# Patient Record
Sex: Female | Born: 1990 | Race: Black or African American | Hispanic: No | Marital: Single | Smoking: Current every day smoker
Health system: Southern US, Community
[De-identification: ages and names within clinical notes are randomized; demographics above are authoritative.]

## PROBLEM LIST (undated history)

## (undated) DIAGNOSIS — F419 Anxiety disorder, unspecified: Secondary | ICD-10-CM

## (undated) HISTORY — PX: FINGER SURGERY: SHX640

---

## 2018-11-05 ENCOUNTER — Emergency Department (HOSPITAL_COMMUNITY)
Admission: EM | Admit: 2018-11-05 | Discharge: 2018-11-05 | Disposition: A | Discharge status: Home / Self Care | Payer: Self-pay | Attending: Emergency Medicine | Admitting: Emergency Medicine

## 2018-11-05 ENCOUNTER — Encounter (HOSPITAL_COMMUNITY): Payer: Self-pay | Admitting: *Deleted

## 2018-11-05 ENCOUNTER — Emergency Department (HOSPITAL_COMMUNITY): Payer: Self-pay

## 2018-11-05 DIAGNOSIS — R69 Illness, unspecified: Secondary | ICD-10-CM

## 2018-11-05 DIAGNOSIS — J111 Influenza due to unidentified influenza virus with other respiratory manifestations: Secondary | ICD-10-CM | POA: Insufficient documentation

## 2018-11-05 DIAGNOSIS — F17200 Nicotine dependence, unspecified, uncomplicated: Secondary | ICD-10-CM | POA: Insufficient documentation

## 2018-11-05 MED ORDER — FLUTICASONE PROPIONATE 50 MCG/ACT NA SUSP: 1.0000 | Freq: Every day | NASAL | 0 refills | Status: DC

## 2018-11-05 MED ORDER — IBUPROFEN 800 MG PO TABS: 800.0000 mg | ORAL_TABLET | Freq: Three times a day (TID) | ORAL | 0 refills | Status: DC

## 2018-11-05 MED ORDER — BENZONATATE 100 MG PO CAPS: 100.0000 mg | ORAL_CAPSULE | Freq: Three times a day (TID) | ORAL | 0 refills | Status: DC | PRN

## 2018-11-05 NOTE — ED Provider Notes (Signed)
[facility] EMERGENCY DEPARTMENT Provider Note   CSN: [id number] Arrival date & time: 11/05/18  1227     History   Chief Complaint Chief Complaint  Patient presents with  . Cough    HPI Amanda Powell is a 28 y.o. female with a history of tobacco abuse presents to the emergency department with complaints of flulike symptoms for the past 3 to 4 days.  Patient states she had congestion, rhinorrhea, scratchy throat, productive cough with green mucus sputum, chills, and generalized body aches.  No specific alleviating or aggravating factors.  She did try some over-the-counter off brand Robitussin without relief-this did help her sleep but did not change her symptoms.  Denies fever, chest pain, dyspnea, wheezing, vomiting, or diarrhea.  HPI  History reviewed. No pertinent past medical history.  There are no active problems to display for this patient.   History reviewed. No pertinent surgical history.   OB History   No obstetric history on file.      Home Medications    Prior to Admission medications   Not on File    Family History History reviewed. No pertinent family history.  Social History Social History   Tobacco Use  . Smoking status: Current Every Day Smoker  . Smokeless tobacco: Never Used  Substance Use Topics  . Alcohol use: Not on file  . Drug use: Not on file     Allergies   Patient has no known allergies.   Review of Systems Review of Systems  Constitutional: Positive for chills. Negative for fever.  HENT: Positive for congestion and sore throat. Negative for ear pain.   Respiratory: Positive for cough. Negative for shortness of breath and wheezing.   Cardiovascular: Negative for chest pain.  Gastrointestinal: Negative for abdominal pain, diarrhea and vomiting.     Physical Exam Updated Vital Signs BP (!) 108/91 (BP Location: Right Arm)   Pulse 94   Temp 98.6 F (37 C) Comment: Took Tylenol at 11:50AM today; 500mg   Resp  17   Ht 5\' 3"  (1.6 m)   Wt 89.4 kg   LMP 10/22/2018   SpO2 100%   BMI 34.90 kg/m   Physical Exam Vitals signs and nursing note reviewed.  Constitutional:      General: She is not in acute distress.    Appearance: She is well-developed.  HENT:     Head: Normocephalic and atraumatic.     Right Ear: Tympanic membrane is not perforated, erythematous, retracted or bulging.     Left Ear: Tympanic membrane is not perforated, erythematous, retracted or bulging.     Nose: Mucosal edema present.     Right Sinus: No maxillary sinus tenderness or frontal sinus tenderness.     Left Sinus: No maxillary sinus tenderness or frontal sinus tenderness.     Mouth/Throat:     Pharynx: Uvula midline. No oropharyngeal exudate or posterior oropharyngeal erythema.  Eyes:     General:        Right eye: No discharge.        Left eye: No discharge.     Conjunctiva/sclera: Conjunctivae normal.     Pupils: Pupils are equal, round, and reactive to light.  Neck:     Musculoskeletal: Normal range of motion and neck supple. No neck rigidity.  Cardiovascular:     Rate and Rhythm: Normal rate and regular rhythm.     Heart sounds: No murmur.  Pulmonary:     Effort: No respiratory distress.  Breath sounds: Normal breath sounds. No wheezing or rales.  Abdominal:     General: There is no distension.     Palpations: Abdomen is soft.     Tenderness: There is no abdominal tenderness.  Lymphadenopathy:     Cervical: No cervical adenopathy.  Skin:    General: Skin is warm and dry.     Findings: No rash.  Neurological:     Mental Status: She is alert.  Psychiatric:        Behavior: Behavior normal.      ED Treatments / Results  Labs (all labs ordered are listed, but only abnormal results are displayed) Labs Reviewed - No data to display  EKG None  Radiology Dg Chest 2 View  Result Date: 11/05/2018 CLINICAL DATA:  Cough, body aches, chest tightness for 2-3 days - no known heart or lung issues, hx  of smoker EXAM: CHEST - 2 VIEW COMPARISON:  None. FINDINGS: The heart size and mediastinal contours are within normal limits. Both lungs are clear. No pleural effusion or pneumothorax. The visualized skeletal structures are unremarkable. IMPRESSION: Normal chest radiographs. Electronically Signed   By: Amie Portland M.D.   On: 11/05/2018 13:32    Procedures Procedures (including critical care time)  Medications Ordered in ED Medications - No data to display   Initial Impression / Assessment and Plan / ED Course  I have reviewed the triage vital signs and the nursing notes.  Pertinent labs & imaging results that were available during my care of the patient were reviewed by me and considered in my medical decision making (see chart for details).    Patient presents with URI type symptoms.  Patient is nontoxic appearing, in no apparent distress, vitals are without significant abnormality. Patient is afebrile in the ED, lungs are CTA, CXR negative for infiltrate, doubt pneumonia. There is no wheezing or signs of respiratory distress. Sxs onset < 7 days, afebrile, no sinus tenderness, doubt acute bacterial sinusitis. Centor score 0, doubt strep pharyngitis. No evidence of AOM on exam. No meningeal signs. Suspect viral etiology at this time, possibly influenza- outside of tamiflu window, and will treat supportively with Ibuprofen, Flonase, and Tessalon. I discussed results, treatment plan, need for PCP follow-up, and return precautions with the patient. Provided opportunity for questions, patient confirmed understanding and is in agreement with plan.   Final Clinical Impressions(s) / ED Diagnoses   Final diagnoses:  Influenza-like illness    ED Discharge Orders         Ordered    fluticasone (FLONASE) 50 MCG/ACT nasal spray  Daily     11/05/18 1418    benzonatate (TESSALON) 100 MG capsule  3 times daily PRN     11/05/18 1418    ibuprofen (ADVIL,MOTRIN) 800 MG tablet  3 times daily      11/05/18 [address], [place] R, PA-C 11/05/18 1439    Terrilee Files, MD 11/06/18 [phone]

## 2018-11-05 NOTE — Discharge Instructions (Addendum)
You were seen in the emergency today for upper respiratory symptoms, we suspect your symptoms are related to a virus at this time. Your chest xray was normal . We have prescribed you multiple medications to treat your symptoms.  ° °-Flonase to be used 1 spray in each nostril daily.  This medication is used to treat your congestion. ° °-Tessalon can be taken once every 8 hours as needed.  This medication is used to treat your cough. ° °-Ibuprofen to be taken once every 8 hours as needed for pain. Please take this medicine with food as it can cause stomach upset and at worst stomach bleeding. Do not take other NSAIDs such as motrin, aleve, advil, naproxen, mobic, etc as they are similar. You make take tylenol per over the counter dosing with this medicine safely. ° °We have prescribed you new medication(s) today. Discuss the medications prescribed today with your pharmacist as they can have adverse effects and interactions with your other medicines including over the counter and prescribed medications. Seek medical evaluation if you start to experience new or abnormal symptoms after taking one of these medicines, seek care immediately if you start to experience difficulty breathing, feeling of your throat closing, facial swelling, or rash as these could be indications of a more serious allergic reaction ° °You will need to follow-up with your primary care provider in 1 week if your symptoms have not improved.  If you do not have a primary care provider one is provided in your discharge instructions.  Return to the emergency department for any new or worsening symptoms including but not limited to persistent fever for 5 days, difficulty breathing, chest pain, rashes, passing out, or any other concerns.  ° °

## 2018-11-05 NOTE — ED Triage Notes (Signed)
Pt in c/o cough and congestion for the last few days, unsure of fever but has had body aches and chills, no distress noted

## 2018-12-15 ENCOUNTER — Other Ambulatory Visit: Payer: Self-pay

## 2018-12-15 ENCOUNTER — Emergency Department (HOSPITAL_COMMUNITY)
Admission: EM | Admit: 2018-12-15 | Discharge: 2018-12-15 | Disposition: A | Discharge status: Home / Self Care | Payer: Self-pay | Attending: Emergency Medicine | Admitting: Emergency Medicine

## 2018-12-15 ENCOUNTER — Emergency Department (HOSPITAL_COMMUNITY): Payer: Self-pay

## 2018-12-15 DIAGNOSIS — F17200 Nicotine dependence, unspecified, uncomplicated: Secondary | ICD-10-CM | POA: Insufficient documentation

## 2018-12-15 DIAGNOSIS — J101 Influenza due to other identified influenza virus with other respiratory manifestations: Secondary | ICD-10-CM | POA: Insufficient documentation

## 2018-12-15 DIAGNOSIS — Z79899 Other long term (current) drug therapy: Secondary | ICD-10-CM | POA: Insufficient documentation

## 2018-12-15 LAB — INFLUENZA PANEL BY PCR (TYPE A & B)
Influenza A By PCR: NEGATIVE
Influenza B By PCR: POSITIVE — AB

## 2018-12-15 MED ORDER — HYDROCOD POLST-CPM POLST ER 10-8 MG/5ML PO SUER: 5.0000 mL | Freq: Every evening | ORAL | 0 refills | Status: DC | PRN

## 2018-12-15 MED ORDER — AEROCHAMBER Z-STAT PLUS/MEDIUM MISC
1.0000 | Freq: Once | Status: AC
  Administered 2018-12-15: 1

## 2018-12-15 MED ORDER — ONDANSETRON 4 MG PO TBDP
4.0000 mg | ORAL_TABLET | Freq: Once | ORAL | Status: AC
  Administered 2018-12-15: 4 mg via ORAL
  Filled 2018-12-15: qty 1

## 2018-12-15 MED ORDER — ALBUTEROL SULFATE HFA 108 (90 BASE) MCG/ACT IN AERS
2.0000 | INHALATION_SPRAY | RESPIRATORY_TRACT | Status: DC | PRN
  Administered 2018-12-15: 2 via RESPIRATORY_TRACT
  Filled 2018-12-15: qty 6.7

## 2018-12-15 NOTE — ED Provider Notes (Signed)
[facility]-EMERGENCY DEPT Provider Note   CSN: [id number] Arrival date & time: 12/15/18  1501     History   Chief Complaint Chief Complaint  Patient presents with  . Cough    HPI Amanda Powell is a 28 y.o. female every day smoker who presents to the ED with c/o productive cough, fever, chills, congestion and nausea. The symptoms started 4 or 5 days ago and have gotten worse.   The history is provided by the patient. No language interpreter was used.  Cough  Cough characteristics:  Productive Sputum characteristics:  Green Severity:  Moderate Onset quality:  Gradual Duration:  4 days Timing:  Intermittent Progression:  Worsening Chronicity:  New Smoker: no   Context: sick contacts   Relieved by:  Nothing Worsened by:  Smoking and lying down Ineffective treatments:  Cough suppressants Associated symptoms: chills, fever and myalgias   Associated symptoms: no ear pain, no rash, no sinus congestion and no sore throat  Headaches: mild.   Risk factors: no recent travel     No past medical history on file.  There are no active problems to display for this patient.   No past surgical history on file.   OB History   No obstetric history on file.      Home Medications    Prior to Admission medications   Medication Sig Start Date End Date Taking? Authorizing Provider  benzonatate (TESSALON) 100 MG capsule Take 1 capsule (100 mg total) by mouth 3 (three) times daily as needed for cough. 11/05/18   Petrucelli, Samantha R, PA-C  chlorpheniramine-HYDROcodone (TUSSIONEX PENNKINETIC ER) 10-8 MG/5ML SUER Take 5 mLs by mouth at bedtime as needed for cough. 12/15/18   Janne NapoleonNeese, Matisha Termine M, NP  fluticasone (FLONASE) 50 MCG/ACT nasal spray Place 1 spray into both nostrils daily. 11/05/18   Petrucelli, Samantha R, PA-C  ibuprofen (ADVIL,MOTRIN) 800 MG tablet Take 1 tablet (800 mg total) by mouth 3 (three) times daily. 11/05/18   Petrucelli, Pleas KochSamantha R, PA-C    Family  History No family history on file.  Social History Social History   Tobacco Use  . Smoking status: Current Every Day Smoker  . Smokeless tobacco: Never Used  Substance Use Topics  . Alcohol use: Not on file  . Drug use: Not on file     Allergies   Patient has no known allergies.   Review of Systems Review of Systems  Constitutional: Positive for chills and fever.  HENT: Negative for ear pain and sore throat.   Respiratory: Positive for cough.   Gastrointestinal: Positive for nausea. Negative for abdominal pain, diarrhea and vomiting.  Genitourinary: Negative for dysuria, frequency and urgency.  Musculoskeletal: Positive for myalgias.  Skin: Negative for rash.  Neurological: Headaches: mild.  Psychiatric/Behavioral: Negative for confusion.     Physical Exam Updated Vital Signs BP 110/77   Pulse 91   Temp 99.7 F (37.6 C) (Oral)   Resp 18   Ht 5\' 3"  (1.6 m)   Wt 81.2 kg   LMP 12/12/2018 (Approximate)   SpO2 100%   BMI 31.71 kg/m   Physical Exam Vitals signs and nursing note reviewed.  Constitutional:      Appearance: She is well-developed.  HENT:     Head: Normocephalic.     Right Ear: Tympanic membrane normal.     Left Ear: Tympanic membrane normal.     Nose: Congestion present.     Mouth/Throat:     Mouth: Mucous membranes are moist.  Pharynx: Posterior oropharyngeal erythema present.  Eyes:     Extraocular Movements: Extraocular movements intact.     Conjunctiva/sclera: Conjunctivae normal.  Neck:     Musculoskeletal: Neck supple.  Cardiovascular:     Rate and Rhythm: Normal rate and regular rhythm.  Pulmonary:     Effort: Pulmonary effort is normal. No respiratory distress.     Breath sounds: Wheezing present. No rhonchi or rales.  Abdominal:     Palpations: Abdomen is soft.     Tenderness: There is no abdominal tenderness.  Musculoskeletal: Normal range of motion.  Skin:    General: Skin is warm and dry.  Neurological:     Mental  Status: She is alert and oriented to person, place, and time.     Cranial Nerves: No cranial nerve deficit.  Psychiatric:        Mood and Affect: Mood normal.      ED Treatments / Results  Labs (all labs ordered are listed, but only abnormal results are displayed) Labs Reviewed  INFLUENZA PANEL BY PCR (TYPE A & B) - Abnormal; Notable for the following components:      Result Value   Influenza B By PCR POSITIVE (*)    All other components within normal limits   Radiology Dg Chest 2 View  Result Date: 12/15/2018 CLINICAL DATA:  Generalized pain with cough, loss of appetite and headaches. EXAM: CHEST - 2 VIEW COMPARISON:  Radiographs 11/05/2018. FINDINGS: The heart size and mediastinal contours are normal. The lungs are clear. There is no pleural effusion or pneumothorax. No acute osseous findings are identified. IMPRESSION: Stable chest.  No active cardiopulmonary process. Electronically Signed   By: Carey Bullocks M.D.   On: 12/15/2018 16:52    Procedures Procedures (including critical care time)  Medications Ordered in ED Medications  albuterol (PROVENTIL HFA;VENTOLIN HFA) 108 (90 Base) MCG/ACT inhaler 2 puff (2 puffs Inhalation Given 12/15/18 1655)  ondansetron (ZOFRAN-ODT) disintegrating tablet 4 mg (4 mg Oral Given 12/15/18 1656)  aerochamber Z-Stat Plus/medium 1 each (1 each Other Given 12/15/18 1707)     Initial Impression / Assessment and Plan / ED Course  I have reviewed the triage vital signs and the nursing notes. SUBJECTIVE:  Amanda Powell is a 28 y.o. female who present complaining of flu-like symptoms: fevers, chills, myalgias, congestion, sore throat and cough for 4 days.   OBJECTIVE: Appears moderately ill but not toxic; temperature as noted in vitals. Ears normal. Throat and pharynx normal.  Neck supple. No adenopathy in the neck. Sinuses non tender. The chest is clear.  ASSESSMENT: Influenza B  PLAN: Symptomatic therapy suggested: rest, increase fluids,  gargle prn for sore throat and use mist of vaporizer prn. Follow up with your doctor or return here as needed. Patient appears stable for d/c.   Final Clinical Impressions(s) / ED Diagnoses   Final diagnoses:  Influenza B    ED Discharge Orders         Ordered    chlorpheniramine-HYDROcodone (TUSSIONEX PENNKINETIC ER) 10-8 MG/5ML SUER  At bedtime PRN     12/15/18 1947           Janne Napoleon, NP 12/15/18 1949    Melene Plan, DO 12/15/18 2254

## 2018-12-15 NOTE — ED Triage Notes (Signed)
Pt reports she has generalized pain, cough with mucous, loss of appetite, HA, Symptoms over past couple days. pt took Robitussin the other night with no relief.

## 2018-12-15 NOTE — Discharge Instructions (Addendum)
Your influenza screen is positive. Continue your medications from your last visit. Use the inhaler 2 puffs every 4 hours as needed. Take the cough medication I prescribe for you at bedtime and it will help the cough and soreness. Follow up with your doctor. Return here as needed. Be sure to drink plenty of fluids so you do not get dehydrated.

## 2019-01-27 ENCOUNTER — Ambulatory Visit: Payer: Self-pay | Admitting: Family Medicine

## 2020-07-07 ENCOUNTER — Emergency Department (HOSPITAL_COMMUNITY): Payer: Self-pay

## 2020-07-07 ENCOUNTER — Encounter (HOSPITAL_COMMUNITY): Payer: Self-pay | Admitting: Emergency Medicine

## 2020-07-07 ENCOUNTER — Emergency Department (HOSPITAL_COMMUNITY)
Admission: EM | Admit: 2020-07-07 | Discharge: 2020-07-08 | Disposition: A | Discharge status: Home / Self Care | Payer: Self-pay | Attending: Emergency Medicine | Admitting: Emergency Medicine

## 2020-07-07 ENCOUNTER — Other Ambulatory Visit: Payer: Self-pay

## 2020-07-07 DIAGNOSIS — R0602 Shortness of breath: Secondary | ICD-10-CM | POA: Insufficient documentation

## 2020-07-07 DIAGNOSIS — F17200 Nicotine dependence, unspecified, uncomplicated: Secondary | ICD-10-CM | POA: Insufficient documentation

## 2020-07-07 DIAGNOSIS — R05 Cough: Secondary | ICD-10-CM | POA: Insufficient documentation

## 2020-07-07 DIAGNOSIS — R0789 Other chest pain: Secondary | ICD-10-CM

## 2020-07-07 DIAGNOSIS — Z20822 Contact with and (suspected) exposure to covid-19: Secondary | ICD-10-CM | POA: Insufficient documentation

## 2020-07-07 LAB — PROTIME-INR
INR: 1.1 (ref 0.8–1.2)
Prothrombin Time: 13.9 seconds (ref 11.4–15.2)

## 2020-07-07 LAB — BASIC METABOLIC PANEL
Anion gap: 11 (ref 5–15)
BUN: 5 mg/dL — ABNORMAL LOW (ref 6–20)
CO2: 20 mmol/L — ABNORMAL LOW (ref 22–32)
Calcium: 9.9 mg/dL (ref 8.9–10.3)
Chloride: 104 mmol/L (ref 98–111)
Creatinine, Ser: 0.82 mg/dL (ref 0.44–1.00)
GFR calc Af Amer: >60 mL/min (ref >60)
GFR calc non Af Amer: >60 mL/min (ref >60)
Glucose, Bld: 104 mg/dL — ABNORMAL HIGH (ref 70–99)
Potassium: 4 mmol/L (ref 3.5–5.1)
Sodium: 135 mmol/L (ref 135–145)

## 2020-07-07 LAB — CBC
HCT: 36.5 % (ref 36.0–46.0)
Hemoglobin: 12.5 g/dL (ref 12.0–15.0)
MCH: 25.6 pg — ABNORMAL LOW (ref 26.0–34.0)
MCHC: 34.2 g/dL (ref 30.0–36.0)
MCV: 74.6 fL — ABNORMAL LOW (ref 80.0–100.0)
Platelets: 364 10*3/uL (ref 150–400)
RBC: 4.89 MIL/uL (ref 3.87–5.11)
RDW: 14.7 % (ref 11.5–15.5)
WBC: 10.4 10*3/uL (ref 4.0–10.5)
nRBC: 0 % (ref 0.0–0.2)

## 2020-07-07 LAB — I-STAT BETA HCG BLOOD, ED (MC, WL, AP ONLY): I-stat hCG, quantitative: <5 m[IU]/mL (ref <5)

## 2020-07-07 LAB — TROPONIN I (HIGH SENSITIVITY): Troponin I (High Sensitivity): <2 ng/L (ref <18)

## 2020-07-07 NOTE — ED Triage Notes (Signed)
Patient reports intermittent central chest pain x 1 week , mild SOB , no emesis or diaphoresis , denies cough or fever .

## 2020-07-08 ENCOUNTER — Emergency Department (HOSPITAL_COMMUNITY): Payer: Self-pay

## 2020-07-08 ENCOUNTER — Encounter (HOSPITAL_COMMUNITY): Payer: Self-pay | Admitting: Radiology

## 2020-07-08 LAB — D-DIMER, QUANTITATIVE: D-Dimer, Quant: 0.6 ug/mL-FEU — ABNORMAL HIGH (ref 0.00–0.50)

## 2020-07-08 LAB — BRAIN NATRIURETIC PEPTIDE: B Natriuretic Peptide: 10.6 pg/mL (ref 0.0–100.0)

## 2020-07-08 LAB — SARS CORONAVIRUS 2 BY RT PCR (HOSPITAL ORDER, PERFORMED IN ~~LOC~~ HOSPITAL LAB): SARS Coronavirus 2: NEGATIVE

## 2020-07-08 LAB — TROPONIN I (HIGH SENSITIVITY): Troponin I (High Sensitivity): <2 ng/L (ref <18)

## 2020-07-08 MED ORDER — IOHEXOL 350 MG/ML SOLN
65.0000 mL | Freq: Once | INTRAVENOUS | Status: AC | PRN
  Administered 2020-07-08: 65 mL via INTRAVENOUS

## 2020-07-08 NOTE — ED Notes (Signed)
Pt transported to CT ?

## 2020-07-08 NOTE — ED Provider Notes (Signed)
[facility] EMERGENCY DEPARTMENT Provider Note   CSN: [id number] Arrival date & time: 07/07/20  2048     History Chief Complaint  Patient presents with  . Chest Pain    Amanda Powell is a 29 y.o. female.  Patient is a 29 year old female who presents with chest pain and shortness of breath.  She says for the last 1 and half to 2 weeks she has had some intermittent episodes of chest pain and shortness of breath.  Her chest pain is more persistent.  She describes it as a tightness to the center of her chest and at times she has some sharp pains on the sides but those are coming and going.  She also has some intermittent shortness of breath.  It seems to be more when she is lying down.  She has a little bit of a cough but she says its not atypical for her.  No nasal congestion.  No fevers.  No leg swelling or calf tenderness.  She did recently drive to [place].  She says the pain started during the last hour of her drive to [place].  She was seen in the ED 1 week ago in [place] but she left prior to being seen.  She went back 4 days later.  They told her that her potassium was little low but everything else looked okay and she was discharged.  She came back today because her pain feels more persistent.  Its not worse with breathing.  No history of similar symptoms in the past.        History reviewed. No pertinent past medical history.  There are no problems to display for this patient.   History reviewed. No pertinent surgical history.   OB History   No obstetric history on file.     No family history on file.  Social History   Tobacco Use  . Smoking status: Current Every Day Smoker  . Smokeless tobacco: Never Used  Substance Use Topics  . Alcohol use: Never  . Drug use: Never    Home Medications Prior to Admission medications   Medication Sig Start Date End Date Taking? Authorizing Provider  Multiple Vitamins-Minerals (MULTIVITAMIN GUMMIES ADULT  PO) Take by mouth.   Yes [provider]  benzonatate (TESSALON) 100 MG capsule Take 1 capsule (100 mg total) by mouth 3 (three) times daily as needed for cough. Patient not taking: Reported on 07/08/2020 11/05/18   Petrucelli, Lelon Mast R, PA-C  chlorpheniramine-HYDROcodone (TUSSIONEX PENNKINETIC ER) 10-8 MG/5ML SUER Take 5 mLs by mouth at bedtime as needed for cough. Patient not taking: Reported on 07/08/2020 12/15/18   Janne Napoleon, NP  fluticasone [facility]) 50 MCG/ACT nasal spray Place 1 spray into both nostrils daily. Patient not taking: Reported on 07/08/2020 11/05/18   Petrucelli, Pleas Koch, PA-C  ibuprofen (ADVIL,MOTRIN) 800 MG tablet Take 1 tablet (800 mg total) by mouth 3 (three) times daily. Patient not taking: Reported on 07/08/2020 11/05/18   Petrucelli, Pleas Koch, PA-C    Allergies    Patient has no known allergies.  Review of Systems   Review of Systems  Constitutional: Negative for chills, diaphoresis, fatigue and fever.  HENT: Negative for congestion, rhinorrhea and sneezing.   Eyes: Negative.   Respiratory: Positive for cough, chest tightness and shortness of breath.   Cardiovascular: Positive for chest pain. Negative for leg swelling.  Gastrointestinal: Negative for abdominal pain, blood in stool, diarrhea, nausea and vomiting.  Genitourinary: Negative for difficulty urinating, flank pain,  frequency and hematuria.  Musculoskeletal: Negative for arthralgias and back pain.  Skin: Negative for rash.  Neurological: Negative for dizziness, speech difficulty, weakness, numbness and headaches.    Physical Exam Updated Vital Signs BP 104/77   Pulse 76   Temp 98.2 F (36.8 C) (Oral)   Resp 18   Ht 5\' 3"  (1.6 m)   Wt 82 kg   LMP 06/23/2020   SpO2 98%   BMI 32.02 kg/m   Physical Exam Constitutional:      Appearance: She is well-developed.  HENT:     Head: Normocephalic and atraumatic.  Eyes:     Pupils: Pupils are equal, round, and reactive to light.   Cardiovascular:     Rate and Rhythm: Normal rate and regular rhythm.     Heart sounds: Normal heart sounds.  Pulmonary:     Effort: Pulmonary effort is normal. No respiratory distress.     Breath sounds: Normal breath sounds. No wheezing or rales.  Chest:     Chest wall: No tenderness.  Abdominal:     General: Bowel sounds are normal.     Palpations: Abdomen is soft.     Tenderness: There is no abdominal tenderness. There is no guarding or rebound.  Musculoskeletal:        General: Normal range of motion.     Cervical back: Normal range of motion and neck supple.     Comments: No edema or calf tenderness  Lymphadenopathy:     Cervical: No cervical adenopathy.  Skin:    General: Skin is warm and dry.     Findings: No rash.  Neurological:     Mental Status: She is alert and oriented to person, place, and time.     ED Results / Procedures / Treatments   Labs (all labs ordered are listed, but only abnormal results are displayed) Labs Reviewed  BASIC METABOLIC PANEL - Abnormal; Notable for the following components:      Result Value   CO2 20 (*)    Glucose, Bld 104 (*)    BUN 5 (*)    All other components within normal limits  CBC - Abnormal; Notable for the following components:   MCV 74.6 (*)    MCH 25.6 (*)    All other components within normal limits  D-DIMER, QUANTITATIVE (NOT AT [facility]) - Abnormal; Notable for the following components:   D-Dimer, Quant 0.60 (*)    All other components within normal limits  SARS CORONAVIRUS 2 BY RT PCR (HOSPITAL ORDER, PERFORMED IN  HOSPITAL LAB)  PROTIME-INR  BRAIN NATRIURETIC PEPTIDE  I-STAT BETA HCG BLOOD, ED ([facility], [facility], [facility] ONLY)  TROPONIN I (HIGH SENSITIVITY)  TROPONIN I (HIGH SENSITIVITY)    EKG EKG Interpretation  Date/Time:  Friday July 07 2020 21:16:14 EDT Ventricular Rate:  112 PR Interval:  142 QRS Duration: 72 QT Interval:  316 QTC Calculation: 431 R Axis:   84 Text Interpretation: Sinus tachycardia  Nonspecific T wave abnormality Abnormal ECG No old tracing to compare Confirmed by 11-06[birth year]1991 [phone]) on 07/07/2020 11:29:00 PM   Radiology DG Chest 2 View  Result Date: 07/07/2020 CLINICAL DATA:  Chest pain EXAM: CHEST - 2 VIEW COMPARISON:  12/15/2018 FINDINGS: The heart size and mediastinal contours are within normal limits. Both lungs are clear. The visualized skeletal structures are unremarkable. IMPRESSION: No active cardiopulmonary disease. Electronically Signed   By: 02/13/2019 M.D.   On: 07/07/2020 21:58   CT Angio Chest PE W/Cm &/Or  Wo Cm  Result Date: 07/08/2020 CLINICAL DATA:  Shortness of breath and chest pain for a week. PE suspected. Positive D-dimer. EXAM: CT ANGIOGRAPHY CHEST WITH CONTRAST TECHNIQUE: Multidetector CT imaging of the chest was performed using the standard protocol during bolus administration of intravenous contrast. Multiplanar CT image reconstructions and MIPs were obtained to evaluate the vascular anatomy. CONTRAST:  68mL OMNIPAQUE IOHEXOL 350 MG/ML SOLN COMPARISON:  None. FINDINGS: Cardiovascular: There is no pulmonary embolism identified within the main, lobar or segmental pulmonary arteries bilaterally. No thoracic aortic aneurysm or evidence of aortic dissection. 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 Heart size is within normal limits. No pericardial effusion. Mediastinum/Nodes: No mass or enlarged lymph nodes are seen within the mediastinum or perihilar regions. Esophagus appears normal. Trachea and central bronchi are unremarkable. Lungs/Pleura: Lungs are clear.  No pleural effusion or pneumothorax. Upper Abdomen: Limited images of the upper abdomen are unremarkable. Musculoskeletal: No chest wall abnormality. No acute or significant osseous findings. Review of the MIP images confirms the above findings. IMPRESSION: 1. Negative exam. No pulmonary embolism. Lungs are clear. 2.  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 Electronically Signed   By: Bary Richard M.D.   On: 07/08/2020 11:58    Procedures Procedures (including critical care time)  Medications Ordered in ED Medications  iohexol (OMNIPAQUE) 350 MG/ML injection 65 mL (65 mLs Intravenous Contrast Given 07/08/20 1152)    ED Course  I have reviewed the triage vital signs and the nursing notes.  Pertinent labs & imaging results that were available during my care of the patient were reviewed by me and considered in my medical decision making (see chart for details).    MDM Rules/Calculators/A&P                          Patient is a 29 year old female who presents with 2-week history of intermittent chest pain and shortness of breath.  She has no associate abdominal pain.  No exertional symptoms.  She had an EKG which shows no ischemic changes.  She has had 2 - troponins.  Given her travel history, a D-dimer was performed which was slightly elevated.  CT scan of her chest shows no acute abnormality.  No evidence of pulmonary edema.  No PE.  Her BNP is normal with no suggestions of CHF.  She has no hypoxia or persistent tachycardia.  No increased work of breathing.  Her Covid test was negative.  She was discharged home in good condition.  She does not have a PCP and was given an outpatient referral to cardiology if her symptoms continue.  Return precautions were given. Final Clinical Impression(s) / ED Diagnoses Final diagnoses:  Atypical chest pain    Rx / DC Orders ED Discharge Orders    None       Rolan Bucco, MD 07/08/20 1237

## 2020-07-08 NOTE — ED Notes (Signed)
Pt d/c home per MD order. Discharge summary reviewed with pt, pt verbalizes understanding. No s/s of acute distress noted. Ambulatory off unit.  °

## 2020-07-08 NOTE — ED Notes (Signed)
Pt tolerated Fluid/PO challenge well. No reports of nausea

## 2020-07-21 DIAGNOSIS — E876 Hypokalemia: Secondary | ICD-10-CM | POA: Insufficient documentation

## 2020-07-31 ENCOUNTER — Encounter: Payer: Self-pay | Admitting: General Practice

## 2021-12-13 ENCOUNTER — Emergency Department (HOSPITAL_BASED_OUTPATIENT_CLINIC_OR_DEPARTMENT_OTHER)
Admission: EM | Admit: 2021-12-13 | Discharge: 2021-12-13 | Disposition: A | Discharge status: Home / Self Care | Payer: No Typology Code available for payment source | Attending: Emergency Medicine | Admitting: Emergency Medicine

## 2021-12-13 ENCOUNTER — Other Ambulatory Visit: Payer: Self-pay

## 2021-12-13 ENCOUNTER — Encounter (HOSPITAL_BASED_OUTPATIENT_CLINIC_OR_DEPARTMENT_OTHER): Payer: Self-pay

## 2021-12-13 DIAGNOSIS — E876 Hypokalemia: Secondary | ICD-10-CM | POA: Diagnosis not present

## 2021-12-13 DIAGNOSIS — M791 Myalgia, unspecified site: Secondary | ICD-10-CM | POA: Diagnosis not present

## 2021-12-13 DIAGNOSIS — Z79899 Other long term (current) drug therapy: Secondary | ICD-10-CM | POA: Diagnosis not present

## 2021-12-13 DIAGNOSIS — R519 Headache, unspecified: Secondary | ICD-10-CM

## 2021-12-13 DIAGNOSIS — R0602 Shortness of breath: Secondary | ICD-10-CM | POA: Insufficient documentation

## 2021-12-13 DIAGNOSIS — G44209 Tension-type headache, unspecified, not intractable: Secondary | ICD-10-CM | POA: Diagnosis not present

## 2021-12-13 DIAGNOSIS — Z7951 Long term (current) use of inhaled steroids: Secondary | ICD-10-CM | POA: Diagnosis not present

## 2021-12-13 DIAGNOSIS — Z20822 Contact with and (suspected) exposure to covid-19: Secondary | ICD-10-CM | POA: Diagnosis not present

## 2021-12-13 DIAGNOSIS — D649 Anemia, unspecified: Secondary | ICD-10-CM | POA: Diagnosis not present

## 2021-12-13 DIAGNOSIS — R1084 Generalized abdominal pain: Secondary | ICD-10-CM | POA: Diagnosis not present

## 2021-12-13 LAB — URINALYSIS, ROUTINE W REFLEX MICROSCOPIC
Bilirubin Urine: NEGATIVE
Glucose, UA: NEGATIVE mg/dL
Hgb urine dipstick: NEGATIVE
Ketones, ur: NEGATIVE mg/dL
Nitrite: NEGATIVE
Protein, ur: NEGATIVE mg/dL
Specific Gravity, Urine: <1.005 — ABNORMAL LOW (ref 1.005–1.030)
pH: 6.5 (ref 5.0–8.0)

## 2021-12-13 LAB — COMPREHENSIVE METABOLIC PANEL
ALT: 9 U/L (ref 0–44)
AST: 15 U/L (ref 15–41)
Albumin: 4.2 g/dL (ref 3.5–5.0)
Alkaline Phosphatase: 56 U/L (ref 38–126)
Anion gap: 8 (ref 5–15)
BUN: 6 mg/dL (ref 6–20)
CO2: 26 mmol/L (ref 22–32)
Calcium: 9.9 mg/dL (ref 8.9–10.3)
Chloride: 103 mmol/L (ref 98–111)
Creatinine, Ser: 0.66 mg/dL (ref 0.44–1.00)
GFR, Estimated: >60 mL/min (ref >60)
Glucose, Bld: 88 mg/dL (ref 70–99)
Potassium: 3.1 mmol/L — ABNORMAL LOW (ref 3.5–5.1)
Sodium: 137 mmol/L (ref 135–145)
Total Bilirubin: 0.6 mg/dL (ref 0.3–1.2)
Total Protein: 8.2 g/dL — ABNORMAL HIGH (ref 6.5–8.1)

## 2021-12-13 LAB — RESP PANEL BY RT-PCR (FLU A&B, COVID) ARPGX2
Influenza A by PCR: NEGATIVE
Influenza B by PCR: NEGATIVE
SARS Coronavirus 2 by RT PCR: NEGATIVE

## 2021-12-13 LAB — CBC
HCT: 33.3 % — ABNORMAL LOW (ref 36.0–46.0)
Hemoglobin: 11.4 g/dL — ABNORMAL LOW (ref 12.0–15.0)
MCH: 25.2 pg — ABNORMAL LOW (ref 26.0–34.0)
MCHC: 34.2 g/dL (ref 30.0–36.0)
MCV: 73.5 fL — ABNORMAL LOW (ref 80.0–100.0)
Platelets: 324 10*3/uL (ref 150–400)
RBC: 4.53 MIL/uL (ref 3.87–5.11)
RDW: 15.3 % (ref 11.5–15.5)
WBC: 11.2 10*3/uL — ABNORMAL HIGH (ref 4.0–10.5)
nRBC: 0 % (ref 0.0–0.2)

## 2021-12-13 LAB — LIPASE, BLOOD: Lipase: <10 U/L — ABNORMAL LOW (ref 11–51)

## 2021-12-13 LAB — PREGNANCY, URINE: Preg Test, Ur: NEGATIVE

## 2021-12-13 MED ORDER — SODIUM CHLORIDE 0.9 % IV BOLUS
1000.0000 mL | Freq: Once | INTRAVENOUS | Status: AC
  Administered 2021-12-13: 1000 mL via INTRAVENOUS

## 2021-12-13 MED ORDER — PROCHLORPERAZINE EDISYLATE 10 MG/2ML IJ SOLN
5.0000 mg | Freq: Once | INTRAMUSCULAR | Status: AC
Start: 2021-12-13 — End: 2021-12-13
  Administered 2021-12-13: 5 mg via INTRAVENOUS
  Filled 2021-12-13: qty 2

## 2021-12-13 MED ORDER — POTASSIUM CHLORIDE 10 MEQ/100ML IV SOLN
10.0000 meq | Freq: Once | INTRAVENOUS | Status: AC
Start: 2021-12-13 — End: 2021-12-13
  Administered 2021-12-13: 10 meq via INTRAVENOUS
  Filled 2021-12-13: qty 100

## 2021-12-13 MED ORDER — POTASSIUM CHLORIDE ER 10 MEQ PO TBCR: 10.0000 meq | EXTENDED_RELEASE_TABLET | Freq: Two times a day (BID) | ORAL | 0 refills | Status: DC

## 2021-12-13 NOTE — ED Triage Notes (Signed)
Pt presents with headache 2-3 day, abd discomfort 1-2 days, SOB starting today, lump in her throat x2-3 days. Pt reports her "entire body had a fuzzy feeling" yesterday while at work

## 2021-12-13 NOTE — ED Provider Notes (Signed)
[facility] EMERGENCY DEPT Provider Note   CSN: [id number] Arrival date & time: 12/13/21  1813     History  Chief Complaint  Patient presents with   Headache   Abdominal Pain    Amanda Powell is a 31 y.o. female.   Headache Associated symptoms: abdominal pain   Abdominal Pain Patient presents with headache and abdominal pain.  Headache and feeling bad started 2 to 3 days ago.  Then developed some mild diffuse abdominal comfort.  Shortness of breath couple days ago.  No definite sick contacts.  States she was feeling bad.  Dull headache.  Does have a history of headaches and this feels like a typical headache for her.   History reviewed. No pertinent past medical history.  Home Medications Prior to Admission medications   Medication Sig Start Date End Date Taking? Authorizing Provider  potassium chloride (KLOR-CON) 10 MEQ tablet Take 1 tablet (10 mEq total) by mouth 2 (two) times daily. 12/13/21  Yes Benjiman Core, MD  benzonatate (TESSALON) 100 MG capsule Take 1 capsule (100 mg total) by mouth 3 (three) times daily as needed for cough. Patient not taking: Reported on 07/08/2020 11/05/18   Petrucelli, Lelon Mast R, PA-C  chlorpheniramine-HYDROcodone (TUSSIONEX PENNKINETIC ER) 10-8 MG/5ML SUER Take 5 mLs by mouth at bedtime as needed for cough. Patient not taking: Reported on 07/08/2020 12/15/18   Janne Napoleon, NP  fluticasone [facility]) 50 MCG/ACT nasal spray Place 1 spray into both nostrils daily. Patient not taking: Reported on 07/08/2020 11/05/18   Petrucelli, Pleas Koch, PA-C  ibuprofen (ADVIL,MOTRIN) 800 MG tablet Take 1 tablet (800 mg total) by mouth 3 (three) times daily. Patient not taking: Reported on 07/08/2020 11/05/18   Petrucelli, Lelon Mast R, PA-C  Multiple Vitamins-Minerals (MULTIVITAMIN GUMMIES ADULT PO) Take by mouth.    [provider]      Allergies    Patient has no known allergies.    Review of Systems   Review of Systems  Constitutional:   Positive for appetite change.  Gastrointestinal:  Positive for abdominal pain.  Neurological:  Positive for headaches.   Physical Exam Updated Vital Signs BP 108/76    Pulse 87    Temp 98.3 F (36.8 C)    Resp 18    SpO2 98%  Physical Exam Vitals and nursing note reviewed.  Cardiovascular:     Rate and Rhythm: Regular rhythm.  Pulmonary:     Breath sounds: No wheezing.  Abdominal:     Tenderness: There is no abdominal tenderness.  Musculoskeletal:     Cervical back: Normal range of motion.  Skin:    Findings: No erythema.  Neurological:     Mental Status: She is alert.    ED Results / Procedures / Treatments   Labs (all labs ordered are listed, but only abnormal results are displayed) Labs Reviewed  LIPASE, BLOOD - Abnormal; Notable for the following components:      Result Value   Lipase <10 (*)    All other components within normal limits  COMPREHENSIVE METABOLIC PANEL - Abnormal; Notable for the following components:   Potassium 3.1 (*)    Total Protein 8.2 (*)    All other components within normal limits  CBC - Abnormal; Notable for the following components:   WBC 11.2 (*)    Hemoglobin 11.4 (*)    HCT 33.3 (*)    MCV 73.5 (*)    MCH 25.2 (*)    All other components within normal limits  URINALYSIS,  ROUTINE W REFLEX MICROSCOPIC - Abnormal; Notable for the following components:   Color, Urine COLORLESS (*)    Specific Gravity, Urine <1.005 (*)    Leukocytes,Ua TRACE (*)    All other components within normal limits  RESP PANEL BY RT-PCR (FLU A&B, COVID) ARPGX2  PREGNANCY, URINE    EKG None  Radiology No results found.  Procedures Procedures    Medications Ordered in ED Medications  potassium chloride 10 mEq in 100 mL IVPB (0 mEq Intravenous Stopped 12/13/21 2144)  prochlorperazine (COMPAZINE) injection 5 mg (5 mg Intravenous Given 12/13/21 2100)  sodium chloride 0.9 % bolus 1,000 mL (0 mLs Intravenous Stopped 12/13/21 2144)    ED Course/ Medical  Decision Making/ A&P                           Medical Decision Making Amount and/or Complexity of Data Reviewed Labs: ordered.  Risk Prescription drug management.   Patient with URI type symptoms.  Headache shortness of breath myalgias.  Negative COVID test.  Mild hypokalemia.  Hemoglobin slightly low.  Feels better after treatment with a migraine cocktail..  Do not feel the need to head CT to evaluate headache like previous headaches.  Potassium also has been given.  Will discharge home with outpatient follow-up as needed        Final Clinical Impression(s) / ED Diagnoses Final diagnoses:  Acute nonintractable headache, unspecified headache type  Hypokalemia    Rx / DC Orders ED Discharge Orders          Ordered    potassium chloride (KLOR-CON) 10 MEQ tablet  2 times daily        12/13/21 2140              Benjiman Core, MD 12/14/21 0025

## 2021-12-13 NOTE — ED Notes (Signed)
Pt stated she wanted to stop treatment and go home and would sign herself out AMA. MD made aware and discharged pt home. Potassium and fluids stopped, IV removed. Pt stated she felt better. Ambulated out of ED without difficulty.

## 2021-12-19 IMAGING — CT CT ANGIO CHEST
2 of 6 series · 19 of 36 positions shown · IV contrast (omnipaque)
Comparison: None.

CLINICAL DATA: Shortness of breath and chest pain for a week. PE
suspected. Positive D-dimer.

EXAM:
CT ANGIOGRAPHY CHEST WITH CONTRAST
TECHNIQUE: Multidetector CT imaging of the chest was performed using the
standard protocol during bolus administration of intravenous
contrast. Multiplanar CT image reconstructions and MIPs were
obtained to evaluate the vascular anatomy.
CONTRAST:  65mL OMNIPAQUE IOHEXOL 350 MG/ML SOLN

[Series 7: pe thins · axial · 0.62mm/px · z∈[+1163,+1384]mm · 18 of 352 slices shown]
[im 18/352  lung]
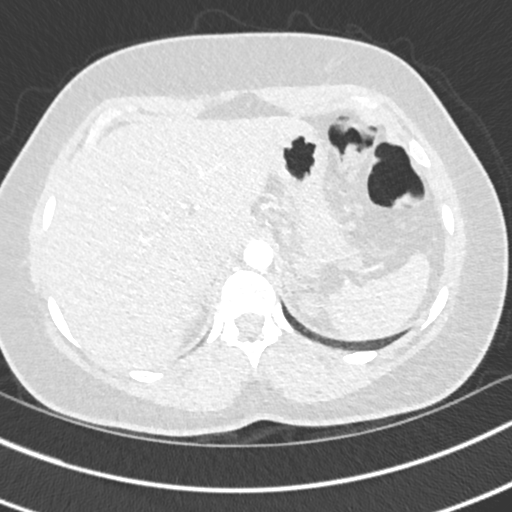
[im 36/352  mediastinal]
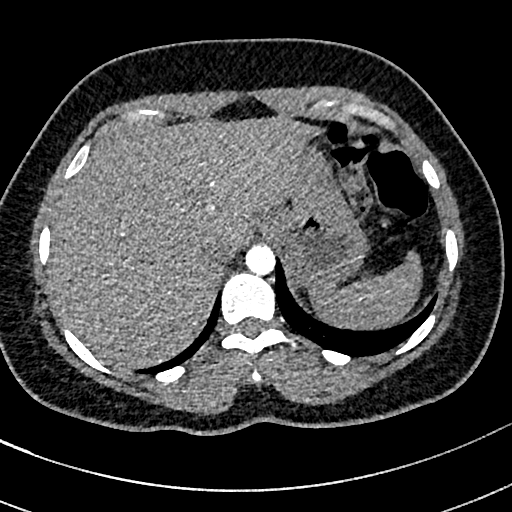
[im 53/352  lung]
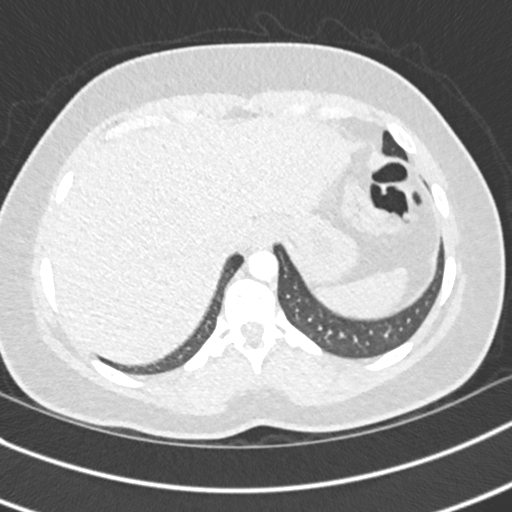
[im 71/352  mediastinal]
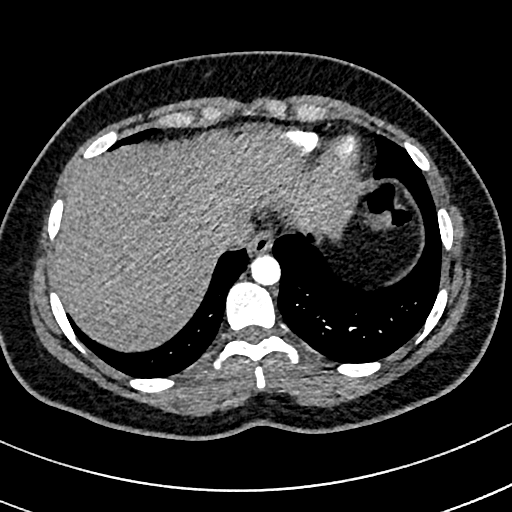
[im 88/352  lung]
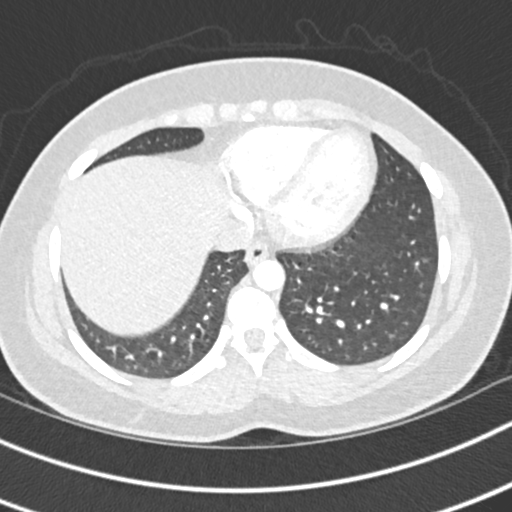
[im 106/352  mediastinal]
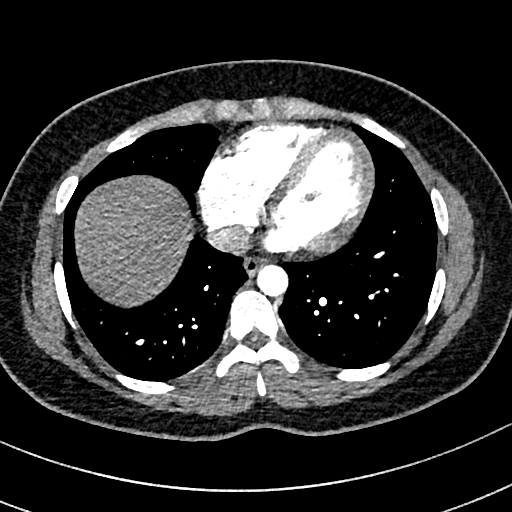
[im 123/352  lung]
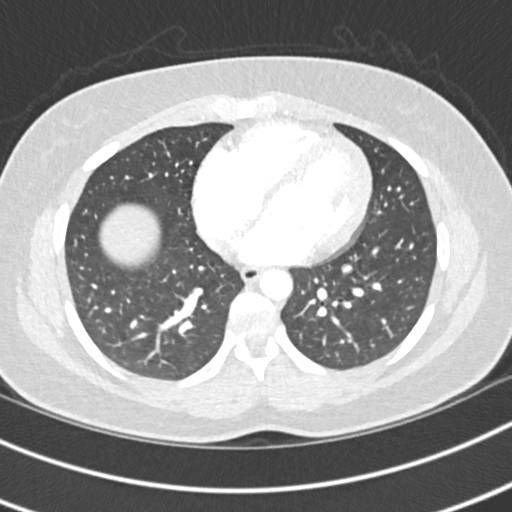
[im 141/352  mediastinal]
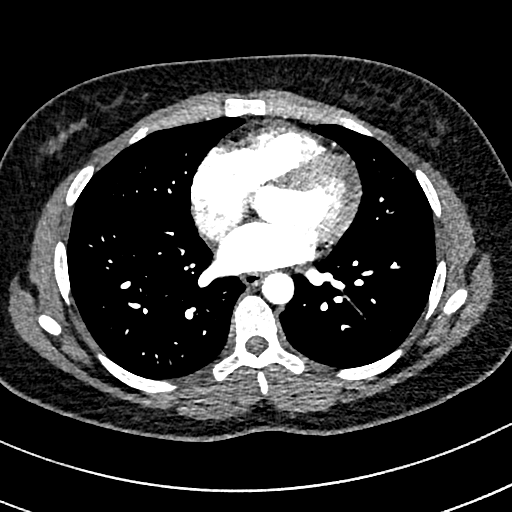
[im 158/352  lung]
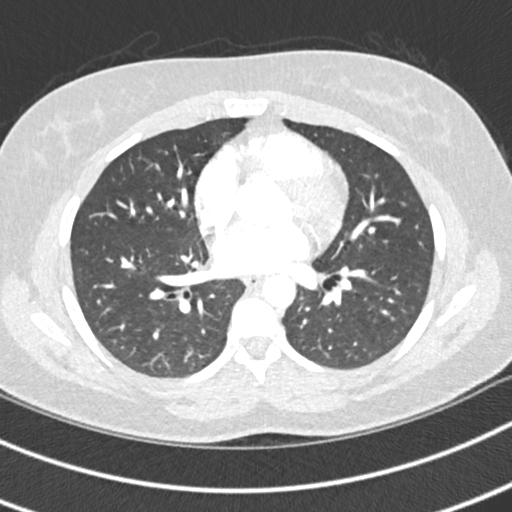
[im 194/352  mediastinal]
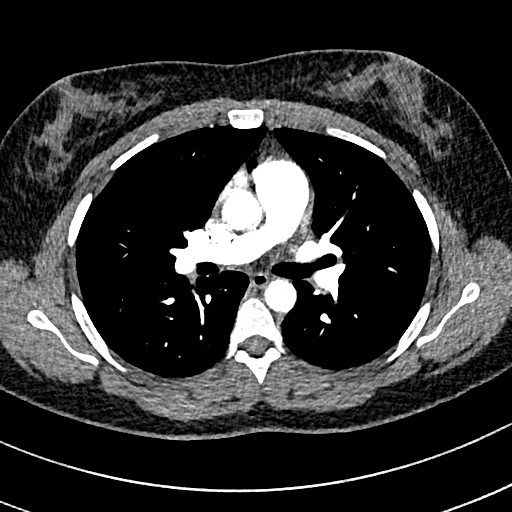
[im 211/352  lung]
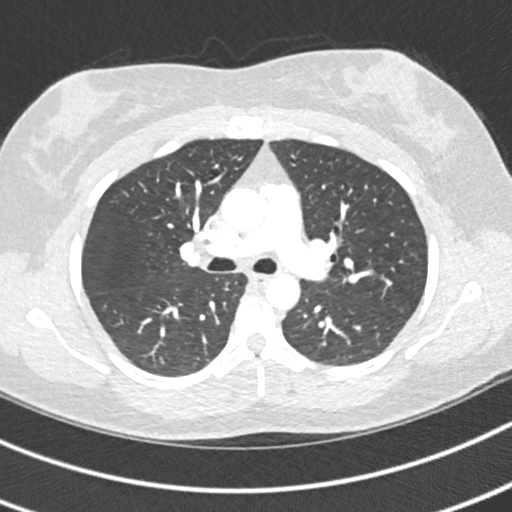
[im 229/352  mediastinal]
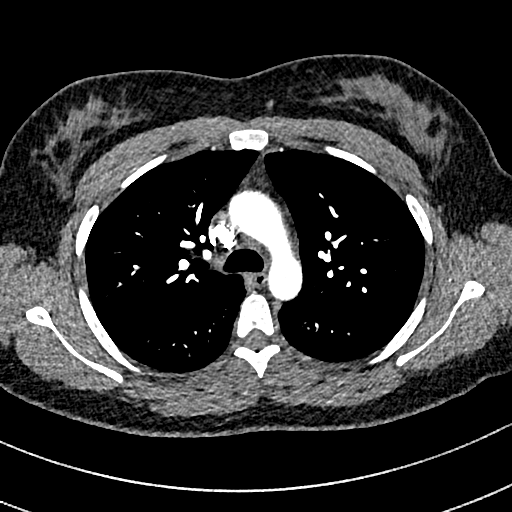
[im 246/352  lung]
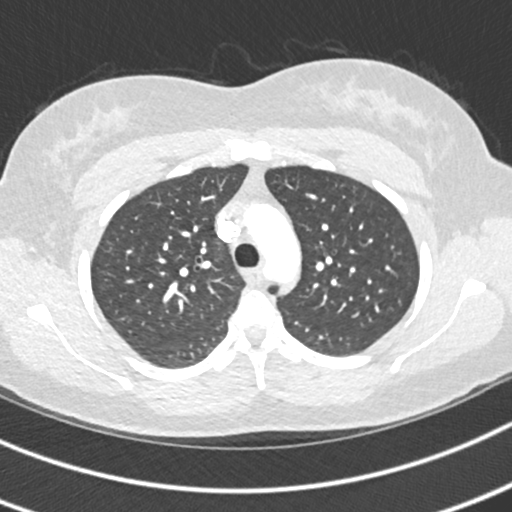
[im 264/352  mediastinal]
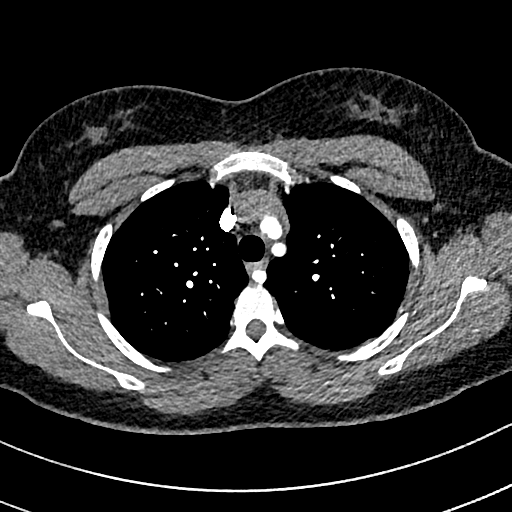
[im 281/352  lung]
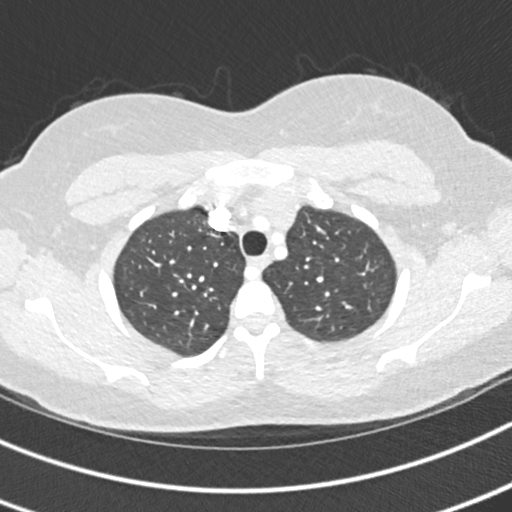
[im 299/352  mediastinal]
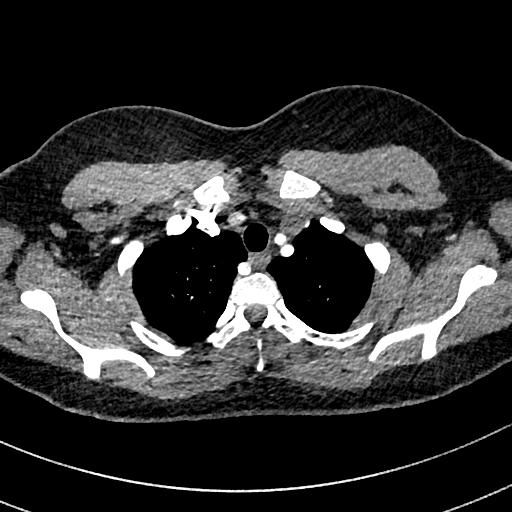
[im 316/352  lung]
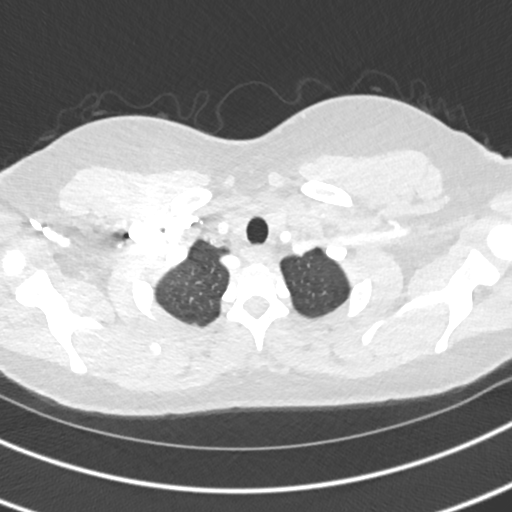
[im 334/352  mediastinal]
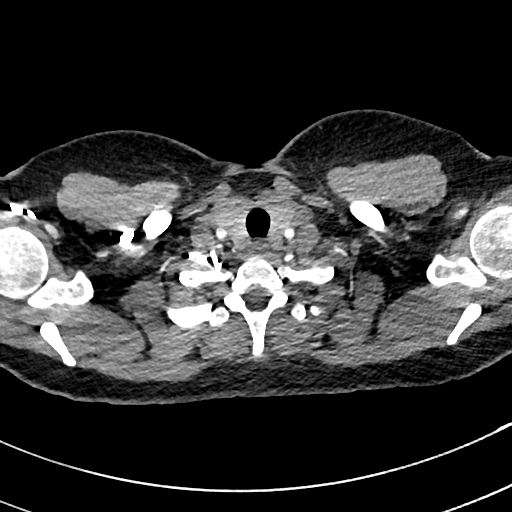

[Series 8: pe 2mm cor · coronal · 0.52mm/px · 1 of 111 slices shown]
[im 56/111  mediastinal]
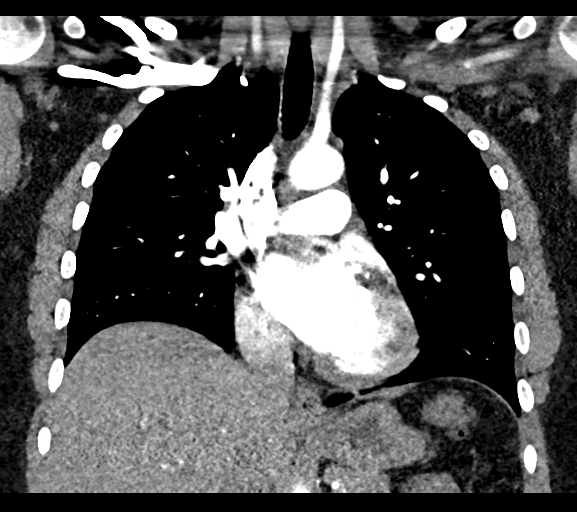

[19 of 36 positions shown; findings below may reference images not displayed]

FINDINGS: Cardiovascular: There is no pulmonary embolism identified within the
main, lobar or segmental pulmonary arteries bilaterally.

No thoracic aortic aneurysm or evidence of aortic dissection.
Incidental note is made of an anomalous origin of the RIGHT
subclavian artery via the proximal descending thoracic aorta. Heart
size is within normal limits. No pericardial effusion.

Mediastinum/Nodes: No mass or enlarged lymph nodes are seen within
the mediastinum or perihilar regions. Esophagus appears normal.
Trachea and central bronchi are unremarkable.

Lungs/Pleura: Lungs are clear.  No pleural effusion or pneumothorax.

Upper Abdomen: Limited images of the upper abdomen are unremarkable.

Musculoskeletal: No chest wall abnormality. No acute or significant
osseous findings.

Review of the MIP images confirms the above findings.
IMPRESSION: 1. Negative exam. No pulmonary embolism. Lungs are clear.
2. Incidental note is made of an anomalous origin of the RIGHT
subclavian artery via the proximal descending thoracic aorta.

## 2022-12-08 ENCOUNTER — Encounter (HOSPITAL_COMMUNITY): Payer: Self-pay

## 2022-12-08 ENCOUNTER — Emergency Department (HOSPITAL_COMMUNITY): Payer: No Typology Code available for payment source

## 2022-12-08 ENCOUNTER — Other Ambulatory Visit: Payer: Self-pay

## 2022-12-08 ENCOUNTER — Emergency Department (HOSPITAL_COMMUNITY)
Admission: EM | Admit: 2022-12-08 | Discharge: 2022-12-08 | Discharge status: Against Medical Advice | Payer: No Typology Code available for payment source | Attending: Emergency Medicine | Admitting: Emergency Medicine

## 2022-12-08 DIAGNOSIS — R0789 Other chest pain: Secondary | ICD-10-CM

## 2022-12-08 DIAGNOSIS — Z5321 Procedure and treatment not carried out due to patient leaving prior to being seen by health care provider: Secondary | ICD-10-CM | POA: Diagnosis not present

## 2022-12-08 HISTORY — DX: Anxiety disorder, unspecified: F41.9

## 2022-12-08 LAB — BASIC METABOLIC PANEL
Anion gap: 9 (ref 5–15)
BUN: 8 mg/dL (ref 6–20)
CO2: 21 mmol/L — ABNORMAL LOW (ref 22–32)
Calcium: 9.2 mg/dL (ref 8.9–10.3)
Chloride: 105 mmol/L (ref 98–111)
Creatinine, Ser: 0.63 mg/dL (ref 0.44–1.00)
GFR, Estimated: >60 mL/min (ref >60)
Glucose, Bld: 96 mg/dL (ref 70–99)
Potassium: 3.6 mmol/L (ref 3.5–5.1)
Sodium: 135 mmol/L (ref 135–145)

## 2022-12-08 LAB — CBC
HCT: 33.8 % — ABNORMAL LOW (ref 36.0–46.0)
Hemoglobin: 11.9 g/dL — ABNORMAL LOW (ref 12.0–15.0)
MCH: 25.7 pg — ABNORMAL LOW (ref 26.0–34.0)
MCHC: 35.2 g/dL (ref 30.0–36.0)
MCV: 73 fL — ABNORMAL LOW (ref 80.0–100.0)
Platelets: 268 10*3/uL (ref 150–400)
RBC: 4.63 MIL/uL (ref 3.87–5.11)
RDW: 13.8 % (ref 11.5–15.5)
WBC: 7.4 10*3/uL (ref 4.0–10.5)
nRBC: 0 % (ref 0.0–0.2)

## 2022-12-08 LAB — TROPONIN I (HIGH SENSITIVITY): Troponin I (High Sensitivity): <2 ng/L (ref <18)

## 2022-12-08 LAB — I-STAT BETA HCG BLOOD, ED (MC, WL, AP ONLY): I-stat hCG, quantitative: <5 m[IU]/mL (ref <5)

## 2022-12-08 NOTE — Discharge Instructions (Signed)
As we discussed you are not fully evaluated for a blood clot in the lung.  This is something that could potentially kill you.  Please return if you start coughing up blood or if you pass out or if your symptoms worsen in any way.  Please follow-up with your family doctor in the office.

## 2022-12-08 NOTE — ED Provider Notes (Signed)
Received patient in turnover from Dr. Betsey Holiday.  Please see their note for further details of Hx, PE.  Briefly patient is a 32 y.o. female with a Chest Pain .  Symptoms thought to be atypical initial troponin negative.  Patient was tachycardic plan to assess with D-dimer.  If positive will CT.  I was called in the patient room because she was telling the nurse she did not want an IV and did not want a D-dimer test.  Shared decision making at bedside, I discussed the rationale for the laboratory test and the rationale for placing an IV line in preparation if her D-dimer was positive.  Patient feels better and declines at this time.  She would like to go home.  She understands the risk that she might have a pulmonary embolism and this is something that could kill her.  She understands to return at any time her symptoms worsen or if she were to develop hemoptysis or if she passes out.    Deno Etienne, DO 12/08/22 [phone]

## 2022-12-08 NOTE — ED Notes (Signed)
Patient refusing IV and additional blood draw for D dimer.

## 2022-12-08 NOTE — ED Notes (Signed)
EDP made aware pt refused d-dimer. EDP at bedside to discuss with pt. Pt reports overall feeling better and not wanting anymore labs.

## 2022-12-08 NOTE — ED Notes (Signed)
Pt verbalized understanding of return instructions.

## 2022-12-08 NOTE — ED Triage Notes (Signed)
Pt comes for anxiety that has been going on since yesterday with some L sided CP, SOB and dizziness

## 2022-12-08 NOTE — ED Provider Notes (Signed)
[facility] Provider Note   CSN: [id number] Arrival date & time: 12/08/22  1601     History  Chief Complaint  Patient presents with   Chest Pain    Amanda Powell is a 32 y.o. female.  Patient presents to the emergency department for evaluation of chest pain.  Patient reports that she was recently diagnosed with anxiety and panic attacks.  For the last 3 days she has been having increased anxiety.  This has coincided with intermittent episodes of a sharp pain under the left breast which would come and go randomly.  It is not related to exertion.  No shortness of breath.  Patient reports that she came home from work tonight and tried to go to bed.  She was unable to sleep because she was feeling anxious.  She then developed a heaviness across her chest which is different than the symptoms she has been having before.  She felt slightly dizzy and some shortness of breath.  Symptoms are improved now.       Home Medications Prior to Admission medications   Medication Sig Start Date End Date Taking? Authorizing Provider  benzonatate (TESSALON) 100 MG capsule Take 1 capsule (100 mg total) by mouth 3 (three) times daily as needed for cough. Patient not taking: Reported on 07/08/2020 11/05/18   Petrucelli, Aldona Bar R, PA-C  chlorpheniramine-HYDROcodone (TUSSIONEX PENNKINETIC ER) 10-8 MG/5ML SUER Take 5 mLs by mouth at bedtime as needed for cough. Patient not taking: Reported on 07/08/2020 12/15/18   Ashley Murrain, NP  fluticasone [facility]) 50 MCG/ACT nasal spray Place 1 spray into both nostrils daily. Patient not taking: Reported on 07/08/2020 11/05/18   Petrucelli, Glynda Jaeger, PA-C  ibuprofen (ADVIL,MOTRIN) 800 MG tablet Take 1 tablet (800 mg total) by mouth 3 (three) times daily. Patient not taking: Reported on 07/08/2020 11/05/18   Petrucelli, Aldona Bar R, PA-C  Multiple Vitamins-Minerals (MULTIVITAMIN GUMMIES ADULT PO) Take by mouth.    [provider]  potassium chloride (KLOR-CON) 10 MEQ tablet Take 1 tablet (10 mEq total) by mouth 2 (two) times daily. 12/13/21   Davonna Belling, MD      Allergies    Patient has no known allergies.    Review of Systems   Review of Systems  Physical Exam Updated Vital Signs BP 106/78 (BP Location: Right Arm)   Pulse (!) 108   Temp 98.5 F (36.9 C) (Oral)   Resp 20   SpO2 99%  Physical Exam Vitals and nursing note reviewed.  Constitutional:      General: She is not in acute distress.    Appearance: She is well-developed.  HENT:     Head: Normocephalic and atraumatic.     Mouth/Throat:     Mouth: Mucous membranes are moist.  Eyes:     General: Vision grossly intact. Gaze aligned appropriately.     Extraocular Movements: Extraocular movements intact.     Conjunctiva/sclera: Conjunctivae normal.  Cardiovascular:     Rate and Rhythm: Normal rate and regular rhythm.     Pulses: Normal pulses.     Heart sounds: Normal heart sounds, S1 normal and S2 normal. No murmur heard.    No friction rub. No gallop.  Pulmonary:     Effort: Pulmonary effort is normal. No respiratory distress.     Breath sounds: Normal breath sounds.  Abdominal:     General: Bowel sounds are normal.     Palpations: Abdomen is soft.  Tenderness: There is no abdominal tenderness. There is no guarding or rebound.     Hernia: No hernia is present.  Musculoskeletal:        General: No swelling.     Cervical back: Full passive range of motion without pain, normal range of motion and neck supple. No spinous process tenderness or muscular tenderness. Normal range of motion.     Right lower leg: No edema.     Left lower leg: No edema.  Skin:    General: Skin is warm and dry.     Capillary Refill: Capillary refill takes less than 2 seconds.     Findings: No ecchymosis, erythema, rash or wound.  Neurological:     General: No focal deficit present.     Mental Status: She is alert and oriented to person, place, and time.      GCS: GCS eye subscore is 4. GCS verbal subscore is 5. GCS motor subscore is 6.     Cranial Nerves: Cranial nerves 2-12 are intact.     Sensory: Sensation is intact.     Motor: Motor function is intact.     Coordination: Coordination is intact.  Psychiatric:        Attention and Perception: Attention normal.        Mood and Affect: Mood normal.        Speech: Speech normal.        Behavior: Behavior normal.     ED Results / Procedures / Treatments   Labs (all labs ordered are listed, but only abnormal results are displayed) Labs Reviewed  CBC - Abnormal; Notable for the following components:      Result Value   Hemoglobin 11.9 (*)    HCT 33.8 (*)    MCV 73.0 (*)    MCH 25.7 (*)    All other components within normal limits  BASIC METABOLIC PANEL  D-DIMER, QUANTITATIVE  I-STAT BETA HCG BLOOD, ED ([facility], [facility], [facility] ONLY)  TROPONIN I (HIGH SENSITIVITY)    EKG None  Radiology DG Chest 2 View  Result Date: 12/08/2022 CLINICAL DATA:  Chest pain EXAM: CHEST - 2 VIEW COMPARISON:  11/05/2022 FINDINGS: Heart size and mediastinal contours are unremarkable. No pleural effusion or edema. No airspace opacities identified. The visualized osseous structures are unremarkable. IMPRESSION: No active cardiopulmonary disease. Electronically Signed   By: Kerby Moors M.D.   On: 12/08/2022 06:30    Procedures Procedures    Medications Ordered in ED Medications - No data to display  ED Course/ Medical Decision Making/ A&P                             Medical Decision Making Amount and/or Complexity of Data Reviewed Labs: ordered. Radiology: ordered.   Presents with chest pain.  Patient does not have significant cardiac risk factors.  Pain is atypical, comes and goes, not related to exertion.  It is predominantly sharp in nature and does not last long.  She did have a different pain tonight with some heaviness across her chest.  Differential diagnosis includes musculoskeletal chest pain,  anxiety and panic attack, PE much less likely ACS.  Cardiac workup initiated.  As she has been tachycardic, likely secondary to her anxiety, cannot rule out PE.  D-dimer will be ordered.  Will sign to oncoming ER physician.  If D-dimer elevated, CT angiography to pursue PE.  If both troponins negative, no evidence of PE, discharged with treatment  for anxiety.        Final Clinical Impression(s) / ED Diagnoses Final diagnoses:  Atypical chest pain    Rx / DC Orders ED Discharge Orders     None         Destanee Bedonie, Gwenyth Allegra, MD 12/08/22 [id number]

## 2022-12-12 ENCOUNTER — Emergency Department (HOSPITAL_COMMUNITY)
Admission: EM | Admit: 2022-12-12 | Discharge: 2022-12-13 | Disposition: A | Discharge status: Home / Self Care | Payer: No Typology Code available for payment source | Attending: Emergency Medicine | Admitting: Emergency Medicine

## 2022-12-12 ENCOUNTER — Other Ambulatory Visit: Payer: Self-pay

## 2022-12-12 DIAGNOSIS — F17200 Nicotine dependence, unspecified, uncomplicated: Secondary | ICD-10-CM | POA: Insufficient documentation

## 2022-12-12 DIAGNOSIS — H53143 Visual discomfort, bilateral: Secondary | ICD-10-CM | POA: Insufficient documentation

## 2022-12-12 DIAGNOSIS — H5310 Unspecified subjective visual disturbances: Secondary | ICD-10-CM

## 2022-12-12 DIAGNOSIS — H538 Other visual disturbances: Secondary | ICD-10-CM | POA: Diagnosis present

## 2022-12-12 NOTE — Discharge Instructions (Signed)
You were evaluated in the Emergency Department and after careful evaluation, we did not find any emergent condition requiring admission or further testing in the hospital.  Your exam/testing today was overall reassuring.  Recommend resting your eyes for a couple days and following up with an ophthalmologist.  Please return to the Emergency Department if you experience any worsening of your condition.  Thank you for allowing [place] to be a part of your care.

## 2022-12-12 NOTE — ED Provider Notes (Signed)
[facility] Emergency Department Provider Note MRN:  [id number]  Arrival date & time: 12/12/22     Chief Complaint   Blurred Vision / "Floaters"    History of Present Illness   Amanda Powell is a 32 y.o. year-old female with a history of anxiety presenting to the ED with chief complaint of blurred vision.  Patient explains that she has been having occasional eyestrain, floaters in her vision, blurred vision for a month or so.  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  Today at work she felt that her eyes were really tired, had trouble seeing the screen, had to rub her eyes frequently and try to refocus her eyes.  This caused her concern.  Review of Systems  A thorough review of systems was obtained and all systems are negative except as noted in the HPI and PMH.   Patient's Health History    Past Medical History:  Diagnosis Date   Anxiety     No past surgical history on file.  No family history on file.  Social History   Socioeconomic History   Marital status: Single    Spouse name: Not on file   Number of children: Not on file   Years of education: Not on file   Highest education level: Not on file  Occupational History   Not on file  Tobacco Use   Smoking status: Every Day   Smokeless tobacco: Never  Substance and Sexual Activity   Alcohol use: Never   Drug use: Never   Sexual activity: Not on file  Other Topics Concern   Not on file  Social History Narrative   Not on file   Social Determinants of Health   Financial Resource Strain: Not on file  Food Insecurity: Not on file  Transportation Needs: Not on file  Physical Activity: Not on file  Stress: Not on file  Social Connections: Not on file  Intimate Partner Violence: Not on file     Physical Exam   Vitals:   12/12/22 2015 12/12/22 2300  BP: 115/77 98/79  Pulse: (!) 101 77  Resp: 18 14  Temp: 98.7 F (37.1 C)   SpO2:  100% 100%    CONSTITUTIONAL: Well-appearing, NAD NEURO/PSYCH:  Alert and oriented x 3, no focal deficits EYES:  eyes equal and reactive ENT/NECK:  no LAD, no JVD CARDIO: Regular rate, well-perfused, normal S1 and S2 PULM:  CTAB no wheezing or rhonchi GI/GU:  non-distended, non-tender MSK/SPINE:  No gross deformities, no edema SKIN:  no rash, atraumatic   *Additional and/or pertinent findings included in MDM below  Diagnostic and Interventional Summary    EKG Interpretation  Date/Time:    Ventricular Rate:    PR Interval:    QRS Duration:   QT Interval:    QTC Calculation:   R Axis:     Text Interpretation:         Labs Reviewed - No data to display  No orders to display    Medications - No data to display   Procedures  /  Critical Care Procedures  ED Course and Medical Decision Making  Initial Impression and Ddx Suspect eyestrain.  She has no vision loss, no eye pain, normal extraocular movements, pupils are equal and reactive.  Normal visual acuity at this time, 20/20 bilaterally on my assessment.  No other symptoms.  Appropriate for reassurance and discharge.  Past medical/surgical history that increases  complexity of ED encounter:    Interpretation of Diagnostics No diagnostics  Patient Reassessment and Ultimate Disposition/Management     Discharge, she will follow-up with an ophthalmologist in town to see if her corrective lenses are the appropriate prescription.  Patient management required discussion with the following services or consulting groups:  None  Complexity of Problems Addressed Acute complicated illness or Injury  Additional Data Reviewed and Analyzed Further history obtained from: None  Additional Factors Impacting ED Encounter Risk None  Barth Kirks. Sedonia Small, [place] [email or web address]  Final Clinical Impressions(s) / ED Diagnoses     ICD-10-CM   1. Bilateral eye strain   H53.10       ED Discharge Orders     None        Discharge Instructions Discussed with and Provided to Patient:    Discharge Instructions      You were evaluated in the Emergency Department and after careful evaluation, we did not find any emergent condition requiring admission or further testing in the hospital.  Your exam/testing today was overall reassuring.  Recommend resting your eyes for a couple days and following up with an ophthalmologist.  Please return to the Emergency Department if you experience any worsening of your condition.  Thank you for allowing [place] to be a part of your care.       Maudie Flakes, MD 12/12/22 2350

## 2022-12-12 NOTE — ED Notes (Signed)
Patient peripheral vision in tact in all 4 quadrants at this time.

## 2022-12-12 NOTE — ED Notes (Signed)
ED Provider at bedside. 

## 2022-12-12 NOTE — ED Triage Notes (Addendum)
Patient reports " floaters" with blurred vision onset yesterday with mild lightheadedness. No vision loss. Wears corrective glasses.

## 2023-05-12 DIAGNOSIS — F4321 Adjustment disorder with depressed mood: Secondary | ICD-10-CM | POA: Diagnosis not present

## 2023-05-26 DIAGNOSIS — R42 Dizziness and giddiness: Secondary | ICD-10-CM | POA: Diagnosis not present

## 2023-05-26 DIAGNOSIS — R197 Diarrhea, unspecified: Secondary | ICD-10-CM | POA: Diagnosis not present

## 2023-08-15 DIAGNOSIS — F419 Anxiety disorder, unspecified: Secondary | ICD-10-CM | POA: Diagnosis not present

## 2023-08-27 DIAGNOSIS — F419 Anxiety disorder, unspecified: Secondary | ICD-10-CM | POA: Diagnosis not present

## 2023-09-03 DIAGNOSIS — F419 Anxiety disorder, unspecified: Secondary | ICD-10-CM | POA: Diagnosis not present

## 2023-09-17 DIAGNOSIS — F411 Generalized anxiety disorder: Secondary | ICD-10-CM | POA: Diagnosis not present

## 2023-09-24 DIAGNOSIS — F411 Generalized anxiety disorder: Secondary | ICD-10-CM | POA: Diagnosis not present

## 2023-09-30 DIAGNOSIS — F411 Generalized anxiety disorder: Secondary | ICD-10-CM | POA: Diagnosis not present

## 2023-10-15 DIAGNOSIS — F411 Generalized anxiety disorder: Secondary | ICD-10-CM | POA: Diagnosis not present

## 2023-10-22 DIAGNOSIS — F411 Generalized anxiety disorder: Secondary | ICD-10-CM | POA: Diagnosis not present

## 2023-10-24 ENCOUNTER — Other Ambulatory Visit (INDEPENDENT_AMBULATORY_CARE_PROVIDER_SITE_OTHER): Payer: 59

## 2023-10-24 ENCOUNTER — Encounter: Payer: Self-pay | Admitting: Family Medicine

## 2023-10-24 ENCOUNTER — Telehealth: Payer: Self-pay | Admitting: Neurology

## 2023-10-24 ENCOUNTER — Ambulatory Visit (INDEPENDENT_AMBULATORY_CARE_PROVIDER_SITE_OTHER): Payer: 59 | Admitting: Family Medicine

## 2023-10-24 VITALS — BP 104/69 | HR 87 | Ht 63.0 in | Wt 142.0 lb

## 2023-10-24 DIAGNOSIS — R7989 Other specified abnormal findings of blood chemistry: Secondary | ICD-10-CM

## 2023-10-24 DIAGNOSIS — Z8639 Personal history of other endocrine, nutritional and metabolic disease: Secondary | ICD-10-CM | POA: Diagnosis not present

## 2023-10-24 DIAGNOSIS — D649 Anemia, unspecified: Secondary | ICD-10-CM

## 2023-10-24 DIAGNOSIS — Z0000 Encounter for general adult medical examination without abnormal findings: Secondary | ICD-10-CM

## 2023-10-24 DIAGNOSIS — E0590 Thyrotoxicosis, unspecified without thyrotoxic crisis or storm: Secondary | ICD-10-CM

## 2023-10-24 DIAGNOSIS — Z9109 Other allergy status, other than to drugs and biological substances: Secondary | ICD-10-CM

## 2023-10-24 DIAGNOSIS — R6339 Other feeding difficulties: Secondary | ICD-10-CM

## 2023-10-24 DIAGNOSIS — F17210 Nicotine dependence, cigarettes, uncomplicated: Secondary | ICD-10-CM | POA: Insufficient documentation

## 2023-10-24 DIAGNOSIS — F32A Depression, unspecified: Secondary | ICD-10-CM

## 2023-10-24 DIAGNOSIS — F419 Anxiety disorder, unspecified: Secondary | ICD-10-CM

## 2023-10-24 LAB — CBC WITH DIFFERENTIAL/PLATELET
Basophils Absolute: 0 10*3/uL (ref 0.0–0.1)
Basophils Relative: 0.5 % (ref 0.0–3.0)
Eosinophils Absolute: 0.1 10*3/uL (ref 0.0–0.7)
Eosinophils Relative: 1.6 % (ref 0.0–5.0)
HCT: 37.1 % (ref 36.0–46.0)
Hemoglobin: 12.3 g/dL (ref 12.0–15.0)
Lymphocytes Relative: 39.4 % (ref 12.0–46.0)
Lymphs Abs: 2.3 10*3/uL (ref 0.7–4.0)
MCHC: 33.1 g/dL (ref 30.0–36.0)
MCV: 80 fL (ref 78.0–100.0)
Monocytes Absolute: 0.9 10*3/uL (ref 0.1–1.0)
Monocytes Relative: 14.9 % — ABNORMAL HIGH (ref 3.0–12.0)
Neutro Abs: 2.5 10*3/uL (ref 1.4–7.7)
Neutrophils Relative %: 43.6 % (ref 43.0–77.0)
Platelets: 257 10*3/uL (ref 150.0–400.0)
RBC: 4.64 Mil/uL (ref 3.87–5.11)
RDW: 13.7 % (ref 11.5–15.5)
WBC: 5.7 10*3/uL (ref 4.0–10.5)

## 2023-10-24 LAB — T4, FREE: Free T4: 1.68 ng/dL — ABNORMAL HIGH (ref 0.60–1.60)

## 2023-10-24 LAB — TSH: TSH: <0.01 u[IU]/mL — ABNORMAL LOW (ref 0.35–5.50)

## 2023-10-24 LAB — COMPREHENSIVE METABOLIC PANEL
ALT: 13 U/L (ref 0–35)
AST: 13 U/L (ref 0–37)
Albumin: 4 g/dL (ref 3.5–5.2)
Alkaline Phosphatase: 46 U/L (ref 39–117)
BUN: 8 mg/dL (ref 6–23)
CO2: 27 meq/L (ref 19–32)
Calcium: 9.2 mg/dL (ref 8.4–10.5)
Chloride: 106 meq/L (ref 96–112)
Creatinine, Ser: 0.54 mg/dL (ref 0.40–1.20)
GFR: 121.06 mL/min (ref >60.00)
Glucose, Bld: 72 mg/dL (ref 70–99)
Potassium: 3.9 meq/L (ref 3.5–5.1)
Sodium: 139 meq/L (ref 135–145)
Total Bilirubin: 0.5 mg/dL (ref 0.2–1.2)
Total Protein: 6.9 g/dL (ref 6.0–8.3)

## 2023-10-24 LAB — LIPID PANEL
Cholesterol: 147 mg/dL (ref 0–200)
HDL: 46.5 mg/dL (ref >39.00)
LDL Cholesterol: 90 mg/dL (ref 0–99)
NonHDL: 100.83
Total CHOL/HDL Ratio: 3
Triglycerides: 52 mg/dL (ref 0.0–149.0)
VLDL: 10.4 mg/dL (ref 0.0–40.0)

## 2023-10-24 LAB — IBC + FERRITIN
Ferritin: 11 ng/mL (ref 10.0–291.0)
Iron: 91 ug/dL (ref 42–145)
Saturation Ratios: 27.8 % (ref 20.0–50.0)
TIBC: 327.6 ug/dL (ref 250.0–450.0)
Transferrin: 234 mg/dL (ref 212.0–360.0)

## 2023-10-24 LAB — MAGNESIUM: Magnesium: 1.8 mg/dL (ref 1.5–2.5)

## 2023-10-24 NOTE — Assessment & Plan Note (Signed)
History of elevated cholesterol. -Order lipid panel to assess current status.

## 2023-10-24 NOTE — Telephone Encounter (Signed)
Thyroid Ultrasound from [facility]   Oct 13, 2023 01:39 PMUS THYROID: Arana,Amanda Powell DOB-Jun 26, 1991 F Exm Date: Oct 13, 2023@13 :39 Req Phys: Theodis Sato Pat Loc: [facility]CC/TW/MD/17 (Req'g Loc) Img LocRolan Bucco Service: Unknown Pregnancy Screen: Patient answered no   [facility] [place], [place] [address] [phone]   (Case [phone] COMPLETE) US THYROID ([place] Detailed) [id number] Reason for Study: abnormal lab  Clinical History:  Report Status: Verified Date Reported: Oct 13, 2023 Date Verified: Oct 13, 2023 Verifier E-Sig:/ES/Amanda A MOYERS, MD  Report: History: abnormal lab  Comparison: None  Technique: Ultrasound of the thyroid gland and adjacent soft tissues was performed.  Findings:  The right lobe of the thyroid measures 5.6 x 1.9 x 1.9 cm. The isthmus measures 3 mm in thickness. The left lobe of the thyroid measures 4.7 x 1.9 x 1.7 cm. No suspicious nodule is seen.    Impression:   No suspicious lesions are identified.  Electronically Signed By: Amanda Powell Electronically Signed On: 10/13/2023 3:49 PM  Primary Diagnostic Code: NO ALERT REQUIRED  Primary Interpreting Staff: Amanda Lack, MD, RADIOLOGIST (Verifier) Amanda Powell Powell,[facility]

## 2023-10-24 NOTE — Addendum Note (Signed)
Addended by: Hyman Hopes B on: 10/24/2023 02:13 PM   Modules accepted: Orders

## 2023-10-24 NOTE — Assessment & Plan Note (Signed)
Mild symptoms based on scores. Currently in a program called [facility]. No history of medication use for these conditions. -Continue current program. Consider referral to Psychiatry or Behavioral Health if needed after completion of current program.

## 2023-10-24 NOTE — Assessment & Plan Note (Signed)
Half a pack of cigarettes daily since 2013. Expressed interest in quitting. Multiple failed attempts with over-the-counter nicotine replacement therapies. -Encouraged to continue attempts at cessation. Follow-up if she'd like additional help. Might be worth trying Wellbutrin given mood concerns as well.

## 2023-10-24 NOTE — Patient Instructions (Signed)
 Thank you for choosing [facility] for your Primary Care needs. I am excited for the opportunity to partner with you to meet your health care goals. It was a pleasure meeting you today!  Information on diet, exercise, and health maintenance recommendations are listed below. This is information to help you be sure you are on track for optimal health and monitoring.   Please look over this and let us know if you have any questions or if you have completed any of the health maintenance outside of [facility] so that we can be sure your records are up to date.  ___________________________________________________________  MyChart:  For all urgent or time sensitive needs we ask that you please call the office to avoid delays. Our number is [phone]. MyChart is not constantly monitored and due to the large volume of messages a day, replies may take up to 72 business hours.  MyChart Policy: MyChart allows for you to see your visit notes, after visit summary, provider recommendations, lab and tests results, make an appointment, request refills, and contact your provider or the office for non-urgent questions or concerns. Providers are seeing patients during normal business hours and do not have built in time to review MyChart messages.  We ask that you allow a minimum of 3 business days for responses to [employer]. For this reason, please do not send urgent requests through MyChart. Please call the office at [phone]. New and ongoing conditions may require a visit. We have virtual and in-person visits available for your convenience.  Complex MyChart concerns may require a visit. Your provider may request you schedule a virtual or in-person visit to ensure we are providing the best care possible. MyChart messages sent after 11:00 AM on Friday may not be received by the provider until Monday morning.    Lab and Test Results: You will receive your lab and test  results on MyChart as soon as they are completed and results have been sent by the lab or testing facility. Due to this service, you will receive your results BEFORE your provider.  I review lab and test results each morning prior to seeing patients. Some results require collaboration with other providers to ensure you are receiving the most appropriate care. For this reason, we ask that you please allow a minimum of 3-5 business days from the time that ALL results have been received for your provider to receive and review lab and test results and contact you about these.  Most lab and test result comments from the provider will be sent through MyChart. Your provider may recommend changes to the plan of care, follow-up visits, repeat testing, ask questions, or request an office visit to discuss these results. You may reply directly to this message or call the office to provide information for the provider or set up an appointment. In some instances, you will be called with test results and recommendations. Please let us know if this is preferred and we will make note of this in your chart to provide this for you.    If you have not heard a response to your lab or test results in 5 business days from all results returning to MyChart, please call the office to let us know. We ask that you please avoid calling prior to this time unless there is an emergent concern. Due to high call volumes, this can delay the resulting process.  After Hours: For all non-emergency after hours needs, please  call the office at [phone] and select the option to reach the on-call  service. On-call services are shared between multiple [place] offices and therefore it will not be possible to speak directly with your provider. On-call providers may provide medical advice and recommendations, but are unable to provide refills for maintenance medications.  For all emergency or urgent medical needs after normal business hours, we  recommend that you seek care at the closest Urgent Care or Emergency Department to ensure appropriate treatment in a timely manner.  [facility] has a 24 hour emergency room located on the ground floor for your convenience.   Urgent Concerns During the Business Day Providers are seeing patients from 8AM to 5PM with a busy schedule and are most often not able to respond to non-urgent calls until the end of the day or the next business day. If you should have URGENT concerns during the day, please call and speak to the nurse or schedule a same day appointment so that we can address your concern without delay.   Thank you, again, for choosing me as your health care partner. I appreciate your trust and look forward to learning more about you!   Lollie Marrow Reola Calkins, DNP, FNP-C  ___________________________________________________________  Health Maintenance Recommendations Screening Testing Mammogram Every 1-2 years based on history and risk factors Starting at age 89 Pap Smear Ages 21-39 every 3 years Ages 67-65 every 5 years with HPV testing More frequent testing may be required based on results and history Colon Cancer Screening Every 1-10 years based on test performed, risk factors, and history Starting at age 108 Bone Density Screening Every 2-10 years based on history Starting at age 80 for women Recommendations for men differ based on medication usage, history, and risk factors AAA Screening One time ultrasound Men 52-68 years old who have ever smoked Lung Cancer Screening Low Dose Lung CT every 12 months Age 29-80 years with a 20 pack-year smoking history who still smoke or who have quit within the last 15 years  Screening Labs Routine  Labs: Complete Blood Count (CBC), Complete Metabolic Panel (CMP), Cholesterol (Lipid Panel) Every 6-12 months based on history and medications May be recommended more frequently based on current conditions or previous results Hemoglobin  A1c Lab Every 3-12 months based on history and previous results Starting at age 1 or earlier with diagnosis of diabetes, high cholesterol, BMI >26, and/or risk factors Frequent monitoring for patients with diabetes to ensure blood sugar control Thyroid Panel  Every 6 months based on history, symptoms, and risk factors May be repeated more often if on medication HIV One time testing for all patients 92 and older May be repeated more frequently for patients with increased risk factors or exposure Hepatitis C One time testing for all patients 19 and older May be repeated more frequently for patients with increased risk factors or exposure Gonorrhea, Chlamydia Every 12 months for all sexually active persons 13-24 years Additional monitoring may be recommended for those who are considered high risk or who have symptoms PSA Men 34-21 years old with risk factors Additional screening may be recommended from age 37-69 based on risk factors, symptoms, and history  Vaccine Recommendations Tetanus Booster All adults every 10 years Flu Vaccine All patients 6 months and older every year COVID Vaccine All patients 12 years and older Initial dosing with booster May recommend additional booster based on age and health history HPV Vaccine 2 doses all patients age 3-26 Dosing may be considered  for patients over 26 Shingles Vaccine (Shingrix) 2 doses all adults 50 years and older Pneumonia (Pneumovax 23) All adults 65 years and older May recommend earlier dosing based on health history Pneumonia (Prevnar 60) All adults 65 years and older Dosed 1 year after Pneumovax 23 Pneumonia (Prevnar 20) All adults 65 years and older (adults 19-64 with certain conditions or risk factors) 1 dose  For those who have not received Prevnar 13 vaccine previously   Additional Screening, Testing, and Vaccinations may be recommended on an individualized basis based on family history, health history, risk  factors, and/or exposure.  __________________________________________________________  Diet Recommendations for All Patients  I recommend that all patients maintain a diet low in saturated fats, carbohydrates, and cholesterol. While this can be challenging at first, it is not impossible and small changes can make big differences.  Things to try: Decreasing the amount of soda, sweet tea, and/or juice to one or less per day and replace with water While water is always the first choice, if you do not like water you may consider adding a water additive without sugar to improve the taste other sugar free drinks Replace potatoes with a brightly colored vegetable  Use healthy oils, such as canola oil or olive oil, instead of butter or hard margarine Limit your bread intake to two pieces or less a day Replace regular pasta with low carb pasta options Bake, broil, or grill foods instead of frying Monitor portion sizes  Eat smaller, more frequent meals throughout the day instead of large meals  An important thing to remember is, if you love foods that are not great for your health, you don't have to give them up completely. Instead, allow these foods to be a reward when you have done well. Allowing yourself to still have special treats every once in a while is a nice way to tell yourself thank you for working hard to keep yourself healthy.   Also remember that every day is a new day. If you have a bad day and "fall off the wagon", you can still climb right back up and keep moving along on your journey!  We have resources available to help you!  Some websites that may be helpful include: www.http://www.wall-[place].info/  Www.VeryWellFit.com _____________________________________________________________  Activity Recommendations for All Patients  I recommend that all adults get at least 30 minutes of moderate physical activity that elevates your heart rate at least 5 days out of the week.  Some examples  include: Walking or jogging at a pace that allows you to carry on a conversation Cycling (stationary bike or outdoors) Water aerobics Yoga Weight lifting Dancing If physical limitations prevent you from putting stress on your joints, exercise in a pool or seated in a chair are excellent options.  Do determine your MAXIMUM heart rate for activity: 220 - YOUR AGE = MAX Heart Rate   Remember! Do not push yourself too hard.  Start slowly and build up your pace, speed, weight, time in exercise, etc.  Allow your body to rest between exercise and get good sleep. You will need more water than normal when you are exerting yourself. Do not wait until you are thirsty to drink. Drink with a purpose of getting in at least 8, 8 ounce glasses of water a day plus more depending on how much you exercise and sweat.    If you begin to develop dizziness, chest pain, abdominal pain, jaw pain, shortness of breath, headache, vision changes, lightheadedness, or other concerning symptoms,  stop the activity and allow your body to rest. If your symptoms are severe, seek emergency evaluation immediately. If your symptoms are concerning, but not severe, please let us know so that we can recommend further evaluation.

## 2023-10-24 NOTE — Progress Notes (Signed)
New Patient Office Visit  Subjective    Patient ID: Amanda Powell, female    DOB: Jun 10, 1991  Age: 32 y.o. MRN: [id number]  CC:  Chief Complaint  Patient presents with   Establish Care    HPI Amanda Powell presents to establish care. She lives alone. 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    Discussed the use of AI scribe software for clinical note transcription with the patient, who gave verbal consent to proceed.  History of Present Illness   The patient, a Engineer, materials with a history of anxiety, presents for a new patient consultation. They report no chronic conditions or current medications. The patient has a history of finger surgery in childhood due to a traumatic injury. Family history is significant for high cholesterol, hypertension, anxiety, depression, diabetes, and pancreatic cancer.  The patient is a smoker, consuming half a pack of cigarettes daily since 2013. They have attempted to quit multiple times using various methods, including toothpicks, sunflower seeds, cold [place], nicotine patches, and gum, but have been unsuccessful due to skin irritation from the patches.  The patient reports no current sexual activity and regular menstrual cycles. They have no known medication allergies and are not on any form of birth control. Recent blood work revealed low magnesium levels and mild anemia. The patient admits to not taking the recommended magnesium supplement. They also report previous issues with potassium levels.  The patient has been experiencing food aversions, which they attribute to anxiety, leading to a poor appetite and weight fluctuations. They express concern about potential food allergies and request a second opinion. They also report facial puffiness when around dogs, suggesting a possible allergy.  The patient has been participating in a program called "[facility]" for anxiety and depression management. They report mild anxiety and depression  but have never been officially diagnosed or treated with medication. They deny any thoughts of self-harm. The patient lives alone and works as a Engineer, materials. They express interest in continuing with psychiatric or behavioral counseling after the program ends.             Wt Readings from Last 3 Encounters:  10/24/23 142 lb (64.4 kg)  07/07/20 180 lb 12.4 oz (82 kg)  12/15/18 179 lb (81.2 kg)     Outpatient Encounter Medications as of 10/24/2023  Medication Sig   Multiple Vitamins-Minerals (MULTIVITAMIN GUMMIES ADULT PO) Take by mouth.   [DISCONTINUED] potassium chloride (KLOR-CON) 10 MEQ tablet Take 1 tablet (10 mEq total) by mouth 2 (two) times daily.   [DISCONTINUED] benzonatate (TESSALON) 100 MG capsule Take 1 capsule (100 mg total) by mouth 3 (three) times daily as needed for cough. (Patient not taking: Reported on 07/08/2020)   [DISCONTINUED] chlorpheniramine-HYDROcodone (TUSSIONEX PENNKINETIC ER) 10-8 MG/5ML SUER Take 5 mLs by mouth at bedtime as needed for cough. (Patient not taking: Reported on 07/08/2020)   [DISCONTINUED] fluticasone (FLONASE) 50 MCG/ACT nasal spray Place 1 spray into both nostrils daily. (Patient not taking: Reported on 07/08/2020)   [DISCONTINUED] ibuprofen (ADVIL,MOTRIN) 800 MG tablet Take 1 tablet (800 mg total) by mouth 3 (three) times daily. (Patient not taking: Reported on 07/08/2020)   No facility-administered encounter medications on file as of 10/24/2023.    Past Medical History:  Diagnosis Date   Anxiety     Past Surgical History:  Procedure Laterality Date   FINGER SURGERY      Family History  Problem Relation Age of Onset   Anxiety disorder Mother  Depression Mother    Hypertension Mother    Hyperlipidemia Mother    Diabetes Maternal Grandmother    Pancreatic cancer Maternal Aunt     Social History   Socioeconomic History   Marital status: Single    Spouse name: Not on file   Number of children: Not on file   Years of  education: Not on file   Highest education level: Not on file  Occupational History   Not on file  Tobacco Use   Smoking status: Every Day    Current packs/day: 0.50    Average packs/day: 0.5 packs/day for 12.0 years (6.0 ttl pk-yrs)    Types: Cigarettes    Start date: 2013   Smokeless tobacco: Never  Substance and Sexual Activity   Alcohol use: Never   Drug use: Never   Sexual activity: Not on file  Other Topics Concern   Not on file  Social History Narrative   Not on file   Social Drivers of Health   Financial Resource Strain: Not on file  Food Insecurity: Not on file  Transportation Needs: Not on file  Physical Activity: Not on file  Stress: Not on file  Social Connections: Unknown (03/19/2022)   Received from [facility]   Social Network    Social Network: Not on file  Intimate Partner Violence: Unknown (02/08/2022)   Received from [facility], [facility]   HITS    Physically Hurt: Not on file    Insult or Talk Down To: Not on file    Threaten Physical Harm: Not on file    Scream or Curse: Not on file    ROS All review of systems negative except what is listed in the HPI      Objective    BP 104/69   Pulse 87   Ht 5\' 3"  (1.6 m)   Wt 142 lb (64.4 kg)   SpO2 100%   BMI 25.15 kg/m   Physical Exam Vitals reviewed.  Constitutional:      Appearance: Normal appearance.  Cardiovascular:     Rate and Rhythm: Normal rate and regular rhythm.     Heart sounds: Normal heart sounds.  Pulmonary:     Effort: Pulmonary effort is normal.     Breath sounds: Normal breath sounds.  Skin:    General: Skin is warm and dry.  Neurological:     Mental Status: She is alert and oriented to person, place, and time.  Psychiatric:        Mood and Affect: Mood normal.        Behavior: Behavior normal.        Thought Content: Thought content normal.        Judgment: Judgment normal.         Assessment & Plan:   Problem List Items Addressed This  Visit       Active Problems   Anxiety and depression - Primary   Mild symptoms based on scores. Currently in a program called [facility]. No history of medication use for these conditions. -Continue current program. Consider referral to Psychiatry or Behavioral Health if needed after completion of current program.      Relevant Orders   TSH   Anemia   History of low iron levels. Recent labs showed anemia. -Order iron studies to assess current status.      Relevant Orders   IBC + Ferritin   CBC with Differential/Platelet   Cigarette nicotine dependence without complication  Half a pack of cigarettes daily since 2013. Expressed interest in quitting. Multiple failed attempts with over-the-counter nicotine replacement therapies. -Encouraged to continue attempts at cessation. Follow-up if she'd like additional help. Might be worth trying Wellbutrin given mood concerns as well.       History of hyperlipidemia   History of elevated cholesterol. -Order lipid panel to assess current status.      Relevant Orders   Lipid panel   Other Visit Diagnoses       Encounter for medical examination to establish care          Hypomagnesemia     Previous low magnesium level. -Order magnesium level to assess current status.   Relevant Orders   Comprehensive metabolic panel   TSH   Magnesium     Environmental allergies       Food aversion Concerns about food aversions and reaction to dogs. Previous allergy testing done in the past. -Referral to Allergy for further evaluation.   Relevant Orders   Ambulatory referral to Allergy                      Return if symptoms worsen or fail to improve.   Clayborne Dana, NP

## 2023-10-24 NOTE — Telephone Encounter (Signed)
Thanks

## 2023-10-24 NOTE — Assessment & Plan Note (Signed)
History of low iron levels. Recent labs showed anemia. -Order iron studies to assess current status.

## 2023-11-01 DIAGNOSIS — F411 Generalized anxiety disorder: Secondary | ICD-10-CM | POA: Diagnosis not present

## 2023-11-02 DIAGNOSIS — R0789 Other chest pain: Secondary | ICD-10-CM | POA: Diagnosis not present

## 2023-11-02 DIAGNOSIS — R002 Palpitations: Secondary | ICD-10-CM | POA: Diagnosis not present

## 2023-11-02 DIAGNOSIS — D649 Anemia, unspecified: Secondary | ICD-10-CM | POA: Diagnosis not present

## 2023-11-03 ENCOUNTER — Encounter: Payer: Self-pay | Admitting: Family Medicine

## 2023-11-11 ENCOUNTER — Other Ambulatory Visit: Payer: Self-pay

## 2023-11-11 ENCOUNTER — Encounter (HOSPITAL_BASED_OUTPATIENT_CLINIC_OR_DEPARTMENT_OTHER): Payer: Self-pay | Admitting: Urology

## 2023-11-11 ENCOUNTER — Emergency Department (HOSPITAL_BASED_OUTPATIENT_CLINIC_OR_DEPARTMENT_OTHER)
Admission: EM | Admit: 2023-11-11 | Discharge: 2023-11-12 | Disposition: A | Discharge status: Home / Self Care | Payer: No Typology Code available for payment source | Attending: Emergency Medicine | Admitting: Emergency Medicine

## 2023-11-11 DIAGNOSIS — U071 COVID-19: Secondary | ICD-10-CM | POA: Insufficient documentation

## 2023-11-11 DIAGNOSIS — R0981 Nasal congestion: Secondary | ICD-10-CM | POA: Diagnosis present

## 2023-11-11 LAB — RESP PANEL BY RT-PCR (RSV, FLU A&B, COVID)  RVPGX2
Influenza A by PCR: NEGATIVE
Influenza B by PCR: NEGATIVE
Resp Syncytial Virus by PCR: NEGATIVE
SARS Coronavirus 2 by RT PCR: POSITIVE — AB

## 2023-11-11 MED ORDER — ACETAMINOPHEN 325 MG PO TABS
650.0000 mg | ORAL_TABLET | Freq: Once | ORAL | Status: AC | PRN
  Administered 2023-11-12: 650 mg via ORAL
  Filled 2023-11-11: qty 2

## 2023-11-11 NOTE — ED Triage Notes (Signed)
 Pt states this am started to have congestion, headaches, body aches, chills  Works here and is exposed flu and COVID

## 2023-11-11 NOTE — ED Notes (Signed)
 Pt wants to hold tylenol at this time,states does not take medication

## 2023-11-12 NOTE — ED Provider Notes (Signed)
   [facility]  Provider Note  CSN: [id number] Arrival date & time: 11/11/23 2210  History Chief Complaint  Patient presents with   Flu like symptoms     Amanda Powell is a 33 y.o. female with no significant PMH reports 1 day of congestion, headache, body aches and chills. No reported fever.    Home Medications Prior to Admission medications   Medication Sig Start Date End Date Taking? Authorizing Provider  Multiple Vitamins-Minerals (MULTIVITAMIN GUMMIES ADULT PO) Take by mouth.    [provider]     Allergies    Patient has no known allergies.   Review of Systems   Review of Systems Please see HPI for pertinent positives and negatives  Physical Exam BP 114/78 (BP Location: Left Arm)   Pulse (!) 118   Temp (!) 100.7 F (38.2 C) (Oral)   Resp 18   Ht 5' 3 (1.6 m)   Wt 64.4 kg   SpO2 100%   BMI 25.15 kg/m   Physical Exam Vitals and nursing note reviewed.  Constitutional:      Appearance: Normal appearance.  HENT:     Head: Normocephalic and atraumatic.     Nose: Nose normal.     Mouth/Throat:     Mouth: Mucous membranes are moist.  Eyes:     Extraocular Movements: Extraocular movements intact.     Conjunctiva/sclera: Conjunctivae normal.  Cardiovascular:     Rate and Rhythm: Tachycardia present.  Pulmonary:     Effort: Pulmonary effort is normal.     Breath sounds: Normal breath sounds.  Abdominal:     General: Abdomen is flat.     Palpations: Abdomen is soft.     Tenderness: There is no abdominal tenderness.  Musculoskeletal:        General: No swelling. Normal range of motion.     Cervical back: Neck supple.  Skin:    General: Skin is warm and dry.  Neurological:     General: No focal deficit present.     Mental Status: She is alert.  Psychiatric:        Mood and Affect: Mood normal.     ED Results / Procedures / Treatments   EKG None  Procedures Procedures  Medications Ordered in  the ED Medications  acetaminophen  (TYLENOL ) tablet 650 mg (650 mg Oral Given 11/12/23 0033)    Initial Impression and Plan  Patient here with 1 day of viral URI symptoms. Covid/Flu/RSV swab is positive for Covid. Patient reports increased anxiety related to this diagnosis and HR has increased as a result. I reassured her that her exam and vitals are reassuring, low likelihood she will have any acute or long term deleterious effects of this infection but that she can expect to feel poorly for the next several days. Recommend she take OTC symptomatic medications, oral hydration, rest and isolation. Monitor SpO2 at home and RTED if she is worsening.   ED Course       MDM Rules/Calculators/A&P Medical Decision Making Problems Addressed: COVID-19: acute illness or injury  Amount and/or Complexity of Data Reviewed Labs: ordered. Decision-making details documented in ED Course.  Risk OTC drugs.     Final Clinical Impression(s) / ED Diagnoses Final diagnoses:  COVID-19    Rx / DC Orders ED Discharge Orders     None        Roselyn Carlin NOVAK, MD 11/12/23 [phone]

## 2023-11-16 ENCOUNTER — Emergency Department (HOSPITAL_BASED_OUTPATIENT_CLINIC_OR_DEPARTMENT_OTHER): Payer: 59

## 2023-11-16 ENCOUNTER — Encounter (HOSPITAL_BASED_OUTPATIENT_CLINIC_OR_DEPARTMENT_OTHER): Payer: Self-pay | Admitting: Emergency Medicine

## 2023-11-16 ENCOUNTER — Emergency Department (HOSPITAL_BASED_OUTPATIENT_CLINIC_OR_DEPARTMENT_OTHER)
Admission: EM | Admit: 2023-11-16 | Discharge: 2023-11-16 | Disposition: A | Discharge status: Home / Self Care | Payer: 59 | Attending: Student | Admitting: Student

## 2023-11-16 DIAGNOSIS — U071 COVID-19: Secondary | ICD-10-CM | POA: Diagnosis not present

## 2023-11-16 DIAGNOSIS — F17210 Nicotine dependence, cigarettes, uncomplicated: Secondary | ICD-10-CM | POA: Diagnosis not present

## 2023-11-16 DIAGNOSIS — R0789 Other chest pain: Secondary | ICD-10-CM | POA: Diagnosis not present

## 2023-11-16 DIAGNOSIS — M791 Myalgia, unspecified site: Secondary | ICD-10-CM | POA: Insufficient documentation

## 2023-11-16 LAB — CBC WITH DIFFERENTIAL/PLATELET
Abs Immature Granulocytes: 0 10*3/uL (ref 0.00–0.07)
Basophils Absolute: 0 10*3/uL (ref 0.0–0.1)
Basophils Relative: 0 %
Eosinophils Absolute: 0 10*3/uL (ref 0.0–0.5)
Eosinophils Relative: 0 %
HCT: 33.8 % — ABNORMAL LOW (ref 36.0–46.0)
Hemoglobin: 11.6 g/dL — ABNORMAL LOW (ref 12.0–15.0)
Lymphocytes Relative: 54 %
Lymphs Abs: 3.6 10*3/uL (ref 0.7–4.0)
MCH: 25.6 pg — ABNORMAL LOW (ref 26.0–34.0)
MCHC: 34.3 g/dL (ref 30.0–36.0)
MCV: 74.4 fL — ABNORMAL LOW (ref 80.0–100.0)
Monocytes Absolute: 0.3 10*3/uL (ref 0.1–1.0)
Monocytes Relative: 4 %
Neutro Abs: 2.8 10*3/uL (ref 1.7–7.7)
Neutrophils Relative %: 42 %
Platelets: 226 10*3/uL (ref 150–400)
RBC: 4.54 MIL/uL (ref 3.87–5.11)
RDW: 13.2 % (ref 11.5–15.5)
Smear Review: NORMAL
WBC: 6.6 10*3/uL (ref 4.0–10.5)
nRBC: 0 % (ref 0.0–0.2)

## 2023-11-16 LAB — BASIC METABOLIC PANEL
Anion gap: 9 (ref 5–15)
BUN: 8 mg/dL (ref 6–20)
CO2: 21 mmol/L — ABNORMAL LOW (ref 22–32)
Calcium: 8.8 mg/dL — ABNORMAL LOW (ref 8.9–10.3)
Chloride: 106 mmol/L (ref 98–111)
Creatinine, Ser: 0.56 mg/dL (ref 0.44–1.00)
GFR, Estimated: >60 mL/min (ref >60)
Glucose, Bld: 109 mg/dL — ABNORMAL HIGH (ref 70–99)
Potassium: 3.5 mmol/L (ref 3.5–5.1)
Sodium: 136 mmol/L (ref 135–145)

## 2023-11-16 LAB — PREGNANCY, URINE: Preg Test, Ur: NEGATIVE

## 2023-11-16 LAB — TROPONIN I (HIGH SENSITIVITY): Troponin I (High Sensitivity): <2 ng/L (ref <18)

## 2023-11-16 MED ORDER — NAPROXEN 375 MG PO TABS: 375.0000 mg | ORAL_TABLET | Freq: Two times a day (BID) | ORAL | 0 refills | Status: DC

## 2023-11-16 NOTE — ED Notes (Signed)

## 2023-11-16 NOTE — ED Notes (Signed)
 Cannot discharge, registration in chart.

## 2023-11-16 NOTE — ED Triage Notes (Signed)
 Pt reports middle chest pressure and middle back pain since yesterday; Dx with Covid Monday; reports dizziness and lightheadedness since Covid dx

## 2023-11-17 NOTE — ED Provider Notes (Signed)
 [place] [facility] Provider Note  CSN: [id number] Arrival date & time: 11/16/23 1818  Chief Complaint(s) Chest Pain and Back Pain  HPI Amanda Powell is a 33 y.o. female with PMH anxiety, anemia who presents emergency room for evaluation of chest and back pain in the setting of a known COVID-19 infection.  Patient states that 6 days ago she was diagnosed with COVID and her upper respiratory symptoms are starting to improve but over the course of the last 24 hours she has had intermittent aches and pains in the lateral aspect of the chest wall and in the back between the shoulder blades.  Denies associated shortness of breath.  Denies nausea, vomiting, fever, headache or other systemic symptoms.  Denies any associated diaphoresis or any exertional component to the patient's chest pain.   Past Medical History Past Medical History:  Diagnosis Date   Anxiety    Patient Active Problem List   Diagnosis Date Noted   Anxiety and depression 10/24/2023   Anemia 10/24/2023   Cigarette nicotine dependence without complication 10/24/2023   History of hyperlipidemia 10/24/2023   Home Medication(s) Prior to Admission medications   Medication Sig Start Date End Date Taking? Authorizing Provider  naproxen  (NAPROSYN ) 375 MG tablet Take 1 tablet (375 mg total) by mouth 2 (two) times daily. 11/16/23  Yes Kristl Morioka, MD  Multiple Vitamins-Minerals (MULTIVITAMIN GUMMIES ADULT PO) Take by mouth.    [provider]                                                                                                                                    Past Surgical History Past Surgical History:  Procedure Laterality Date   FINGER SURGERY     Family History Family History  Problem Relation Age of Onset   Anxiety disorder Mother    Depression Mother    Hypertension Mother    Hyperlipidemia Mother    Diabetes Maternal Grandmother    Pancreatic cancer  Maternal Aunt     Social History Social History   Tobacco Use   Smoking status: Every Day    Current packs/day: 0.50    Average packs/day: 0.5 packs/day for 12.0 years (6.0 ttl pk-yrs)    Types: Cigarettes    Start date: 2013   Smokeless tobacco: Never  Substance Use Topics   Alcohol use: Never   Drug use: Never   Allergies Patient has no known allergies.  Review of Systems Review of Systems  Cardiovascular:  Positive for chest pain.  Musculoskeletal:  Positive for arthralgias and myalgias.    Physical Exam Vital Signs  I have reviewed the triage vital signs BP (!) 110/95 (BP Location: Left Arm)   Pulse 87   Temp 98 F (36.7 C)   Resp 18   Ht 5' 3 (1.6 m)   Wt 64.4 kg   LMP 11/12/2023   SpO2 99%  BMI 25.15 kg/m   Physical Exam Vitals and nursing note reviewed.  Constitutional:      General: She is not in acute distress.    Appearance: She is well-developed.  HENT:     Head: Normocephalic and atraumatic.  Eyes:     Conjunctiva/sclera: Conjunctivae normal.  Cardiovascular:     Rate and Rhythm: Normal rate and regular rhythm.     Heart sounds: No murmur heard. Pulmonary:     Effort: Pulmonary effort is normal. No respiratory distress.     Breath sounds: Normal breath sounds.  Abdominal:     Palpations: Abdomen is soft.     Tenderness: There is no abdominal tenderness.  Musculoskeletal:        General: No swelling.     Cervical back: Neck supple.  Skin:    General: Skin is warm and dry.     Capillary Refill: Capillary refill takes less than 2 seconds.  Neurological:     Mental Status: She is alert.  Psychiatric:        Mood and Affect: Mood normal.     ED Results and Treatments Labs (all labs ordered are listed, but only abnormal results are displayed) Labs Reviewed  BASIC METABOLIC PANEL - Abnormal; Notable for the following components:      Result Value   CO2 21 (*)    Glucose, Bld 109 (*)    Calcium 8.8 (*)    All other components  within normal limits  CBC WITH DIFFERENTIAL/PLATELET - Abnormal; Notable for the following components:   Hemoglobin 11.6 (*)    HCT 33.8 (*)    MCV 74.4 (*)    MCH 25.6 (*)    All other components within normal limits  PREGNANCY, URINE  TROPONIN I (HIGH SENSITIVITY)  TROPONIN I (HIGH SENSITIVITY)                                                                                                                          Radiology DG Chest 2 View Result Date: 11/16/2023 CLINICAL DATA:  Chest pain and pressure. EXAM: CHEST - 2 VIEW COMPARISON:  Radiograph 11/02/2019 FINDINGS: The cardiomediastinal contours are normal. The lungs are clear. Pulmonary vasculature is normal. No consolidation, pleural effusion, or pneumothorax. No acute osseous abnormalities are seen. IMPRESSION: Negative radiographs of the chest. Electronically Signed   By: Andrea Gasman M.D.   On: 11/16/2023 20:27    Pertinent labs & imaging results that were available during my care of the patient were reviewed by me and considered in my medical decision making (see MDM for details).  Medications Ordered in ED Medications - No data to display  Procedures Procedures  (including critical care time)  Medical Decision Making / ED Course   This patient presents to the ED for concern of chest pain, this involves an extensive number of treatment options, and is a complaint that carries with it a high risk of complications and morbidity.  The differential diagnosis includes ACS, Aortic Dissection, Pneumothorax, Pneumonia, Esophageal Rupture, PE, Tamponade/Pericardial Effusion, pericarditis, esophageal spasm, dysrhythmia, GERD, costochondritis.  MDM: Patient seen emergency room for evaluation of chest pain.  Physical exam with reproducible tenderness over the lateral chest wall and between the shoulder  blades but is otherwise unremarkable.  Pulmonary exam is unremarkable.  Patient not hypoxic and is not in respiratory distress.  Laboratory evaluation with an anemia to 11.6 which is near patient's baseline, high-sensitivity troponin is negative, chest x-ray unremarkable.  ECG nonischemic.  Patient is likely experiencing persistent myalgias from her known COVID-19 infection.  Patient low risk by Wells criteria and have a very low suspicion for PE at this time.  Patient's heart score is low and low suspicion for ACS.  At this time she does not meet inpatient criteria for admission and will be discharged with outpatient follow-up.  Naprosyn  given for myalgias.  Given return precautions of which she voiced understanding.   Additional history obtained:  -External records from outside source obtained and reviewed including: Chart review including previous notes, labs, imaging, consultation notes   Lab Tests: -I ordered, reviewed, and interpreted labs.   The pertinent results include:   Labs Reviewed  BASIC METABOLIC PANEL - Abnormal; Notable for the following components:      Result Value   CO2 21 (*)    Glucose, Bld 109 (*)    Calcium 8.8 (*)    All other components within normal limits  CBC WITH DIFFERENTIAL/PLATELET - Abnormal; Notable for the following components:   Hemoglobin 11.6 (*)    HCT 33.8 (*)    MCV 74.4 (*)    MCH 25.6 (*)    All other components within normal limits  PREGNANCY, URINE  TROPONIN I (HIGH SENSITIVITY)  TROPONIN I (HIGH SENSITIVITY)      EKG   EKG Interpretation Date/Time:  Sunday November 16 2023 18:33:55 EST Ventricular Rate:  93 PR Interval:  146 QRS Duration:  76 QT Interval:  390 QTC Calculation: 484 R Axis:   92  Text Interpretation: Normal sinus rhythm with sinus arrhythmia When compared with ECG of 08-Dec-2022 05:59, PREVIOUS ECG IS PRESENT Confirmed by Cristalle Rohm ([id number]) on 11/17/2023 1:36:04 PM         Imaging Studies ordered: I ordered  imaging studies including chest x-ray I independently visualized and interpreted imaging. I agree with the radiologist interpretation   Medicines ordered and prescription drug management: Meds ordered this encounter  Medications   naproxen  (NAPROSYN ) 375 MG tablet    Sig: Take 1 tablet (375 mg total) by mouth 2 (two) times daily.    Dispense:  20 tablet    Refill:  0    -I have reviewed the patients home medicines and have made adjustments as needed  Critical interventions none    Cardiac Monitoring: The patient was maintained on a cardiac monitor.  I personally viewed and interpreted the cardiac monitored which showed an underlying rhythm of: NSR  Social Determinants of Health:  Factors impacting patients care include: none   Reevaluation: After the interventions noted above, I reevaluated the patient and found that they have :stayed the same  Co morbidities that complicate the patient  evaluation  Past Medical History:  Diagnosis Date   Anxiety       Dispostion: I considered admission for this patient, but at this time she does not meet inpatient criteria for admission and she will be discharged with outpatient follow-up     Final Clinical Impression(s) / ED Diagnoses Final diagnoses:  Myalgia  COVID-19     @PCDICTATION @    Albertina Dixon, MD 11/17/23 1336

## 2023-12-31 ENCOUNTER — Ambulatory Visit: Payer: 59 | Admitting: Internal Medicine

## 2024-01-05 ENCOUNTER — Encounter: Payer: Self-pay | Admitting: Family Medicine

## 2024-01-05 ENCOUNTER — Ambulatory Visit (INDEPENDENT_AMBULATORY_CARE_PROVIDER_SITE_OTHER): Payer: 59 | Admitting: Family Medicine

## 2024-01-05 VITALS — BP 114/66 | HR 83 | Ht 63.0 in | Wt 152.0 lb

## 2024-01-05 DIAGNOSIS — M79605 Pain in left leg: Secondary | ICD-10-CM

## 2024-01-05 NOTE — Progress Notes (Signed)
 Acute Office Visit  Subjective:     Patient ID: Amanda Powell, female    DOB: 05-20-1991, 33 y.o.   MRN: [id number]  Chief Complaint  Patient presents with   Leg Swelling    HPI Patient is in today for leg concern.    Discussed the use of AI scribe software for clinical note transcription with the patient, who gave verbal consent to proceed.  History of Present Illness Amanda Powell is a 34 year old female who presents with left leg pain and burning sensation after a massage.  She has been experiencing pain and a burning sensation in her left leg, specifically behind the knee, following a massage on Friday. The area is sensitive to touch. No skin changes, rashes, or discoloration. Her legs appear slightly larger than usual, but this is noted on both sides.  She has recently started wearing [facility] for [facility], which may be affecting her gait. She has been experiencing some changes in her musculature and posture due to these inserts. She is planning to return to the gym and will monitor her symptoms during physical activity.  There is a family history of fluid retention in the legs, which contributes to her concern about the current symptoms.        ROS All review of systems negative except what is listed in the HPI      Objective:    BP 114/66   Pulse 83   Ht 5\' 3"  (1.6 m)   Wt 152 lb (68.9 kg)   SpO2 100%   BMI 26.93 kg/m    Physical Exam Vitals reviewed.  Constitutional:      Appearance: Normal appearance.  Musculoskeletal:        General: No swelling or tenderness. Normal range of motion.     Right lower leg: No edema.     Left lower leg: No edema.     Comments: No edema or skin changes, normal knee and leg ROM, negative Homan's sign  Skin:    General: Skin is warm.     Findings: No bruising, erythema, lesion or rash.  Neurological:     Mental Status: She is alert and oriented to person, place, and time.   Psychiatric:        Mood and Affect: Mood normal.        Behavior: Behavior normal.        Thought Content: Thought content normal.        Judgment: Judgment normal.         No results found for any visits on 01/05/24.      Assessment & Plan:   Problem List Items Addressed This Visit   None Visit Diagnoses       Pain of left lower extremity    -  Primary      Assessment & Plan Left leg discomfort Pain and burning sensation in the left leg, particularly behind the knee, following a massage. No obvious swelling, skin changes, or pitting edema. Possible nerve irritation or hamstring inflammation due to recent changes in gait from new shoe inserts for flat feet. Symptoms were most severe during and immediately after massage, but mostly resolved now. No findings on exam today. - Monitor symptoms, particularly during exercise. - Consider sports medicine or physical therapy referral if symptoms worsen or persist.     No orders of the defined types were placed in this encounter.   Return if symptoms worsen or  fail to improve.  Clayborne Dana, NP

## 2024-01-27 LAB — BASIC METABOLIC PANEL
BUN: 12 (ref 4–21)
CO2: 27 — AB (ref 13–22)
Chloride: 105 (ref 99–108)
Creatinine: 0.7 (ref 0.5–1.1)
Glucose: 110
Potassium: 3.9 meq/L (ref 3.5–5.1)
Sodium: 138 (ref 137–147)

## 2024-01-27 LAB — CBC AND DIFFERENTIAL
HCT: 35 — AB (ref 36–46)
Hemoglobin: 12.1 (ref 12.0–16.0)
Platelets: 217 10*3/uL (ref 150–400)
WBC: 8.4

## 2024-01-27 LAB — HEPATIC FUNCTION PANEL
ALT: 10 U/L (ref 7–35)
AST: 14 (ref 13–35)
Alkaline Phosphatase: 45 (ref 25–125)
Bilirubin, Total: 0.4

## 2024-01-27 LAB — COMPREHENSIVE METABOLIC PANEL
Albumin: 4 (ref 3.5–5.0)
Calcium: 9.4 (ref 8.7–10.7)

## 2024-01-27 LAB — CBC: RBC: 4.53 (ref 3.87–5.11)

## 2024-01-28 ENCOUNTER — Encounter: Payer: Self-pay | Admitting: Neurology

## 2024-01-28 ENCOUNTER — Encounter: Payer: Self-pay | Admitting: Family Medicine

## 2024-01-28 ENCOUNTER — Ambulatory Visit (INDEPENDENT_AMBULATORY_CARE_PROVIDER_SITE_OTHER): Admitting: Family Medicine

## 2024-01-28 VITALS — BP 114/72 | HR 90 | Ht 63.0 in | Wt 150.4 lb

## 2024-01-28 DIAGNOSIS — M79605 Pain in left leg: Secondary | ICD-10-CM

## 2024-01-28 DIAGNOSIS — M79672 Pain in left foot: Secondary | ICD-10-CM | POA: Diagnosis not present

## 2024-01-28 LAB — TROPONIN I: Troponin-I: <3

## 2024-01-28 LAB — PROTEIN, TOTAL: Total Protein: 7.5 (ref 6.4–8.2)

## 2024-01-28 MED ORDER — MELOXICAM 15 MG PO TABS
15.0000 mg | ORAL_TABLET | Freq: Every day | ORAL | 0 refills | Status: DC
Start: 2024-01-28 — End: 2024-07-02

## 2024-01-28 NOTE — Progress Notes (Signed)
 Musculoskeletal Exam  Patient: Amanda Powell DOB: [birth year]  DOS: 01/28/2024  SUBJECTIVE:  Chief Complaint:   Chief Complaint  Patient presents with   Leg Pain    Patient presents today for left leg pain.    Amanda Powell is a 33 y.o.  female for evaluation and treatment of L leg pain.   Onset:  1 month ago. No inj or change in activity.  Location: inner foot radiating up the medial and then posterior leg going proximally Character:  sharp, shooting, and throbbing  Progression of issue:  has worsened Associated symptoms: radiates up leg No bruising, redness, swelling Treatment: to date has been elevation and custom insoles.   Neurovascular symptoms: no  Past Medical History:  Diagnosis Date   Anxiety     Objective: VITAL SIGNS: BP 114/72   Pulse 90   Ht 5\' 3"  (1.6 m)   Wt 150 lb 6.4 oz (68.2 kg)   SpO2 97%   BMI 26.64 kg/m  Constitutional: Well formed, well developed. No acute distress. Thorax & Lungs: No accessory muscle use Musculoskeletal: Foot.   Tenderness to palpation: yes over medial portion of L midfoot Deformity: no Ecchymosis: no Flat-footed b/l Neurologic: Normal sensory function. Gait nml.  Psychiatric: Normal mood. Age appropriate judgment and insight. Alert & oriented x 3.    Assessment:  Left foot pain - Plan: meloxicam (MOBIC) 15 MG tablet, Ambulatory referral to Physical Therapy  Left leg pain - Plan: meloxicam (MOBIC) 15 MG tablet, Ambulatory referral to Physical Therapy  Plan: Refer PT, heat, ice, Tylenol. Mobic prn. She is not pregnant. Has appt w ortho foot/ankle next week.  Iron rich foods and supplement recs provided in AVS.  F/u as originally scheduled w reg PCP. The patient voiced understanding and agreement to the plan.   Jilda Roche [place], DO 01/28/24  2:06 PM

## 2024-01-28 NOTE — Patient Instructions (Addendum)
 Heat (pad or rice pillow in microwave) over affected area, 10-15 minutes twice daily.   Ice/cold pack over area for 10-15 min twice daily.  OK to take Tylenol 1000 mg (2 extra strength tabs) or 975 mg (3 regular strength tabs) every 6 hours as needed.  Try not to go bare-footed in the house.   Take an over the counter iron supplement (Ferrous sulfate) 1-2 times daily as your stomach will allow  Iron Rich foods:  Red meat Prunes Spinach Liver Cream of Wheat  Moderate sources: Iron-fortified cereals (bran, etc) Almonds Beans/peas Pork Lamb Ham Scallops [place] Peaches Peanuts  Let us know if you need anything.

## 2024-02-11 ENCOUNTER — Encounter: Payer: Self-pay | Admitting: Family Medicine

## 2024-02-11 ENCOUNTER — Ambulatory Visit (INDEPENDENT_AMBULATORY_CARE_PROVIDER_SITE_OTHER): Admitting: Family Medicine

## 2024-02-11 VITALS — BP 118/72 | HR 80 | Ht 63.0 in | Wt 153.6 lb

## 2024-02-11 DIAGNOSIS — Z833 Family history of diabetes mellitus: Secondary | ICD-10-CM

## 2024-02-11 DIAGNOSIS — M79672 Pain in left foot: Secondary | ICD-10-CM | POA: Diagnosis not present

## 2024-02-11 NOTE — Progress Notes (Signed)
 Chief Complaint  Patient presents with   Acute Visit    Patient presents today to be checked for diabetes. She reports family history of diabetes and state have had some elevated glucose reading.    Subjective: Patient is a 33 y.o. female here for f/u.  Patient was seen around 2 weeks ago for left foot pain.  She was referred to physical therapy which was denied because she was told she needs to go through the [place].  She has an appoint with the podiatrist tomorrow.  She was prescribed meloxicam she did not fill.  The still on the inside of her foot.  Some associated swelling as the day goes on.  No calf pain.  Past Medical History:  Diagnosis Date   Anxiety     Objective: BP 118/72   Pulse 80   Ht 5\' 3"  (1.6 m)   Wt 153 lb 9.6 oz (69.7 kg)   LMP 02/02/2024 (Exact Date)   SpO2 99%   BMI 27.21 kg/m  General: Awake, appears stated age Heart: RRR, no LE edema Lungs: No accessory muscle use MSK: TTP over the medial left foot musculature, no TTP over the plantar fascia distribution, negative Homans, no calf TTP bilaterally, no extremity asymmetry Psych: Age appropriate judgment and insight, normal affect and mood  Assessment and Plan: Left foot pain  Family history of diabetes mellitus - Plan: Hemoglobin A1c  Appreciate podiatry.  Recommended she does fill the Mobic.  Ice, heat, Tylenol. Check A1c at her request.  She does have a family history.  I am not convinced her pain is related to anything diabetes would cause but given her family history, no likely harm in checking. The patient voiced understanding and agreement to the plan.  Jilda Roche [place], DO 02/11/24  1:39 PM

## 2024-02-11 NOTE — Patient Instructions (Addendum)
 Consider taking the meloxicam.   Give [place] 2-3 business days to get the results of your labs back.   Let us know if you need anything.  Achilles Tendinitis Rehab It is normal to feel mild stretching, pulling, tightness, or discomfort as you do these exercises, but you should stop right away if you feel sudden pain or your pain gets worse.   Stretching and range of motion exercises These exercises warm up your muscles and joints and improve the movement and flexibility of your ankle. These exercises also help to relieve pain, numbness, and tingling. Exercise A: Standing wall calf stretch, knee straight  Stand with your hands against a wall. Extend your affected leg behind you and bend your front knee slightly. Keep both of your heels on the floor. Point the toes of your back foot slightly inward. Keeping your heels on the floor and your back knee straight, shift your weight toward the wall. Do not allow your back to arch. You should feel a gentle stretch in your calf. Hold this position for seconds. Repeat 2 times. Complete this stretch 3 times per week Exercise B: Standing wall calf stretch, knee bent Stand with your hands against a wall. Extend your affected leg behind you, and bend your front knee slightly. Keep both of your heels on the floor. Point the toes of your back foot slightly inward. Keeping your heels on the floor, unlock your back knee so that it is bent. You should feel a gentle stretch deep in your calf. Hold this position for 30 seconds. Repeat 2 times. Complete this stretch 3 times per week.  Strengthening exercises These exercises build strength and control of your ankle. Endurance is the ability to use your muscles for a long time, even after they get tired. Exercise C: Plantar flexion with band  Sit on the floor with your affected leg extended. You may put a pillow under your calf to give your foot more room to move. Loop a rubber exercise band or tube around the  ball of your affected foot. The ball of your foot is on the walking surface, right under your toes. The band or tube should be slightly tense when your foot is relaxed. If the band or tube slips, you can put on your shoe or put a washcloth between the band and your foot to help it stay in place. Slowly point your toes downward, pushing them away from you. Hold this position for 10 seconds. Slowly release the tension in the band or tube, controlling smoothly until your foot is back to the starting position. Repeat 2 times. Complete this exercise 3 times per week. Exercise D: Heel raise with eccentric lower  Stand on a step with the balls of your feet. The ball of your foot is on the walking surface, right under your toes. Do not put your heels on the step. For balance, rest your hands on the wall or on a railing. Rise up onto the balls of your feet. Keeping your heels up, shift all of your weight to your affected leg and pick up your other leg. Slowly lower your affected leg so your heel drops below the level of the step. Put down your foot. If told by your health care provider, build up to: 3 sets of 15 repetitions while keeping your knees straight. 3 sets of 15 repetitions while keeping your knees bent as far as told by your health care provider.  Complete this exercise 3 times per week. If  this exercise is too easy, try doing it while wearing a backpack with weights in it. Balance exercises These exercises improve or maintain your balance. Balance is important in preventing falls. Exercise E: Single leg stand Without shoes, stand near a railing or in a door frame. Hold on to the railing or door frame as needed. Stand on your affected foot. Keep your big toe down on the floor and try to keep your arch lifted. Hold this position for 10 seconds. Repeat 2 times. Complete this exercise 3 times per week. If this exercise is too easy, you can try it with your eyes closed or while standing on a  pillow. Make sure you discuss any questions you have with your health care provider. Document Released: 05/22/2005 Document Revised: 06/27/2016 Document Reviewed: 06/27/2015 Elsevier Interactive Patient Education  [employer].

## 2024-02-12 ENCOUNTER — Encounter: Payer: Self-pay | Admitting: Family Medicine

## 2024-02-12 LAB — HEMOGLOBIN A1C: Hgb A1c MFr Bld: 5.2 % (ref 4.6–6.5)

## 2024-02-25 DIAGNOSIS — F330 Major depressive disorder, recurrent, mild: Secondary | ICD-10-CM | POA: Diagnosis not present

## 2024-03-03 DIAGNOSIS — F330 Major depressive disorder, recurrent, mild: Secondary | ICD-10-CM | POA: Diagnosis not present

## 2024-03-10 DIAGNOSIS — F330 Major depressive disorder, recurrent, mild: Secondary | ICD-10-CM | POA: Diagnosis not present

## 2024-03-12 DIAGNOSIS — F330 Major depressive disorder, recurrent, mild: Secondary | ICD-10-CM | POA: Diagnosis not present

## 2024-03-15 DIAGNOSIS — F330 Major depressive disorder, recurrent, mild: Secondary | ICD-10-CM | POA: Diagnosis not present

## 2024-03-24 DIAGNOSIS — F330 Major depressive disorder, recurrent, mild: Secondary | ICD-10-CM | POA: Diagnosis not present

## 2024-03-31 DIAGNOSIS — F330 Major depressive disorder, recurrent, mild: Secondary | ICD-10-CM | POA: Diagnosis not present

## 2024-04-07 DIAGNOSIS — F330 Major depressive disorder, recurrent, mild: Secondary | ICD-10-CM | POA: Diagnosis not present

## 2024-04-08 ENCOUNTER — Ambulatory Visit (INDEPENDENT_AMBULATORY_CARE_PROVIDER_SITE_OTHER)

## 2024-04-08 ENCOUNTER — Ambulatory Visit (INDEPENDENT_AMBULATORY_CARE_PROVIDER_SITE_OTHER): Admitting: Podiatry

## 2024-04-08 ENCOUNTER — Encounter: Payer: Self-pay | Admitting: Podiatry

## 2024-04-08 DIAGNOSIS — S8392XA Sprain of unspecified site of left knee, initial encounter: Secondary | ICD-10-CM | POA: Insufficient documentation

## 2024-04-08 DIAGNOSIS — K219 Gastro-esophageal reflux disease without esophagitis: Secondary | ICD-10-CM | POA: Insufficient documentation

## 2024-04-08 DIAGNOSIS — M722 Plantar fascial fibromatosis: Secondary | ICD-10-CM

## 2024-04-08 DIAGNOSIS — S93692A Other sprain of left foot, initial encounter: Secondary | ICD-10-CM

## 2024-04-08 DIAGNOSIS — Z5689 Other problems related to employment: Secondary | ICD-10-CM | POA: Insufficient documentation

## 2024-04-08 DIAGNOSIS — Z713 Dietary counseling and surveillance: Secondary | ICD-10-CM | POA: Insufficient documentation

## 2024-04-08 DIAGNOSIS — M2140 Flat foot [pes planus] (acquired), unspecified foot: Secondary | ICD-10-CM | POA: Insufficient documentation

## 2024-04-08 DIAGNOSIS — R030 Elevated blood-pressure reading, without diagnosis of hypertension: Secondary | ICD-10-CM | POA: Insufficient documentation

## 2024-04-08 DIAGNOSIS — M79673 Pain in unspecified foot: Secondary | ICD-10-CM | POA: Insufficient documentation

## 2024-04-09 ENCOUNTER — Encounter: Payer: Self-pay | Admitting: Podiatry

## 2024-04-09 DIAGNOSIS — F330 Major depressive disorder, recurrent, mild: Secondary | ICD-10-CM | POA: Diagnosis not present

## 2024-04-09 DIAGNOSIS — S93692A Other sprain of left foot, initial encounter: Secondary | ICD-10-CM

## 2024-04-09 NOTE — Progress Notes (Signed)
 Subjective:  Patient ID: Amanda Powell, female    DOB: Jul 08, 1991,  MRN: [id number] HPI Chief Complaint  Patient presents with   Plantar Fasciitis    Rm7 Patient complains of left heel pain that radiates all over the foot/pt has a history of plantar fasciitis/ pt has tried custom inserts and shoe gear changes with not relief.    33 y.o. female presents with the above complaint.   ROS: Denies fever chills nausea vomiting muscle aches pains calf pain back pain chest pain shortness of breath.  She states that she has had this for many years started while she was in the [employer].  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  She was provided with steroids nonsteroidals orthotics walking boots night splints heel lifts physical therapy and has seen multiple doctors with multiple injections.  She did relate Seve some relief initially but now has gotten worse and is affecting her ability to perform her job and maintain her general good health.  Past Medical History:  Diagnosis Date   Anxiety    Past Surgical History:  Procedure Laterality Date   FINGER SURGERY      Current Outpatient Medications:    famotidine (PEPCID) 20 MG tablet, Take 20 mg by mouth. (Patient not taking: Reported on 04/08/2024), Disp: , Rfl:    meloxicam  (MOBIC ) 15 MG tablet, Take 1 tablet (15 mg total) by mouth daily. (Patient not taking: Reported on 04/08/2024), Disp: 21 tablet, Rfl: 0   Multiple Vitamins-Minerals (MULTIVITAMIN GUMMIES ADULT PO), Take by mouth. (Patient not taking: Reported on 04/08/2024), Disp: , Rfl:   No Known Allergies Review of Systems Objective:  There were no vitals filed for this visit.  General: Well developed, nourished, in no acute distress, alert and oriented x3   Dermatological: Skin is warm, dry and supple bilateral. Nails x 10 are well maintained; remaining integument appears unremarkable at this time. There are no open sores, no preulcerative  lesions, no rash or signs of infection present.  Vascular: Dorsalis Pedis artery and Posterior Tibial artery pedal pulses are 2/4 bilateral with immedate capillary fill time. Pedal hair growth present. No varicosities and no lower extremity edema present bilateral.   Neruologic: Grossly intact via light touch bilateral. Vibratory intact via tuning fork bilateral. Protective threshold with Semmes Wienstein monofilament intact to all pedal sites bilateral. Patellar and Achilles deep tendon reflexes 2+ bilateral. No Babinski or clonus noted bilateral.   Musculoskeletal: No gross boney pedal deformities bilateral. No pain, crepitus, or limitation noted with foot and ankle range of motion bilateral. Muscular strength 5/5 in all groups tested bilateral.  She has severe pain on palpation medial calcaneal tubercle of the left heel.  She even has pain on medial-lateral compression of the calcaneus but radiographically no fracture of the body of the calcaneus is noted.  No Achilles tendinitis.  Mild pes planus is noted.  Gait: Unassisted, Nonantalgic.    Radiographs:  Radiographs taken today do not demonstrate any osseous abnormality.  Good bone mineralization.  No plantar heel spur.  And currently there is only some thickening just distal to the insertion site of the plantar fascia.  There is also some radiolucency in the area questioning whether there is a tear in that fascia that may be resulting in some of the chronic pain.  Assessment & Plan:   Assessment: Planter fasciitis possible tear resulting in chronic fasciitis.  Plan: Discussed etiology pathology conservative versus surgical therapies I did suggest an  MRI.  States that most likely she is going to have to aspiration or have it done at the [place].  We will help her anyway we can.     [name], [place]

## 2024-04-14 DIAGNOSIS — F330 Major depressive disorder, recurrent, mild: Secondary | ICD-10-CM | POA: Diagnosis not present

## 2024-04-14 DIAGNOSIS — F411 Generalized anxiety disorder: Secondary | ICD-10-CM | POA: Diagnosis not present

## 2024-04-19 NOTE — Addendum Note (Signed)
 Addended by: Sanda Crome on: 04/19/2024 10:38 AM   Modules accepted: Orders

## 2024-05-19 ENCOUNTER — Encounter: Admitting: Family Medicine

## 2024-06-10 ENCOUNTER — Ambulatory Visit: Admitting: Podiatry

## 2024-06-17 ENCOUNTER — Encounter: Payer: Self-pay | Admitting: Podiatry

## 2024-06-17 ENCOUNTER — Ambulatory Visit (INDEPENDENT_AMBULATORY_CARE_PROVIDER_SITE_OTHER): Admitting: Podiatry

## 2024-06-17 DIAGNOSIS — L603 Nail dystrophy: Secondary | ICD-10-CM

## 2024-06-17 DIAGNOSIS — M722 Plantar fascial fibromatosis: Secondary | ICD-10-CM

## 2024-06-17 NOTE — Progress Notes (Signed)
 She presents today for follow-up of her pain to the plantar aspect of her left heel.  She states that is still the same is unchanged.  Objective: Vital signs stable alert oriented x 3.  Pulses are palpable.  Severe pain on palpation medial calcaneal tubercle of the left heel.  MRI did not demonstrate any type of plantar fascial tear.  It did comment heavily on subtalar joint capsulitis and synovitis.  Thick yellow dystrophic with mycotic nail  Assessment: Capsulitis synovitis.  Dystrophic nail hallux left  Plan: Discussed etiology pathology and surgical therapies at this point we are going to send out for an over read and start her on physical therapy for this plantar fasciitis and took samples of skin and nail today for pathologic evaluation.  Follow-up with her in 1 month.

## 2024-07-01 ENCOUNTER — Ambulatory Visit: Payer: Self-pay | Admitting: Podiatry

## 2024-07-01 ENCOUNTER — Other Ambulatory Visit: Payer: Self-pay | Admitting: Podiatry

## 2024-07-01 ENCOUNTER — Encounter: Payer: Self-pay | Admitting: Podiatry

## 2024-07-01 NOTE — Progress Notes (Signed)
Spoke to patient is aware

## 2024-07-01 NOTE — Progress Notes (Unsigned)
 Complete physical exam  Patient: Amanda Powell   DOB: [birth year]   33 y.o. Female  MRN: [id number]  Subjective:    Chief Complaint  Patient presents with   Annual Exam    Amanda Powell is a 33 y.o. female who presents today for a complete physical exam. She reports consuming a general diet. The patient has a physically strenuous job, but has no regular exercise apart from work.  She generally feels well. She reports sleeping well. She does not have additional problems to discuss today.   Currently lives with: alone Acute concerns or interim problems since last visit: no   Vision concerns: no Dental concerns: needs to schedule STD concerns: no  ETOH use: rare Nicotine use: cigarettes, half pack a day Recreational drugs/illegal substances: no   Females:  She is not currently  sexually active  Contraception choices are: none LMP: 07/01/24      Most recent fall risk assessment:    07/02/2024   10:22 AM  Fall Risk   Falls in the past year? 0  Number falls in past yr: 0  Injury with Fall? 0  Risk for fall due to : No Fall Risks  Follow up Falls evaluation completed     Most recent depression screenings:    07/02/2024   10:22 AM 10/24/2023    9:10 AM  PHQ 2/9 Scores  PHQ - 2 Score 2 2  PHQ- 9 Score 4 7      07/02/2024   10:23 AM 10/24/2023    9:12 AM  GAD 7 : Generalized Anxiety Score  Nervous, Anxious, on Edge 2 2  Control/stop worrying 1 2  Worry too much - different things 1 2  Trouble relaxing 0 0  Restless 0 0  Easily annoyed or irritable 1 0  Afraid - awful might happen 1 3  Total GAD 7 Score 6 9  Anxiety Difficulty Somewhat difficult Somewhat difficult             Patient Care Team: Almarie Waddell NOVAK, NP as PCP - General (Family Medicine)   Outpatient Medications Prior to Visit  Medication Sig   [DISCONTINUED] famotidine (PEPCID) 20 MG tablet Take 20 mg by mouth. (Patient not taking: Reported on 04/08/2024)   [DISCONTINUED] meloxicam   (MOBIC ) 15 MG tablet Take 1 tablet (15 mg total) by mouth daily. (Patient not taking: Reported on 04/08/2024)   [DISCONTINUED] Multiple Vitamins-Minerals (MULTIVITAMIN GUMMIES ADULT PO) Take by mouth. (Patient not taking: Reported on 04/08/2024)   No facility-administered medications prior to visit.    ROS All review of systems negative except what is listed in the HPI        Objective:     BP 122/80   Pulse 87   Ht 5' 3 (1.6 m)   Wt 152 lb (68.9 kg)   SpO2 100%   BMI 26.93 kg/m    Physical Exam Vitals reviewed.  Constitutional:      General: She is not in acute distress.    Appearance: Normal appearance. She is not ill-appearing.  HENT:     Head: Normocephalic and atraumatic.     Right Ear: Tympanic membrane normal.     Left Ear: Tympanic membrane normal.     Nose: Nose normal.     Mouth/Throat:     Mouth: Mucous membranes are moist.     Pharynx: Oropharynx is clear.  Eyes:     Extraocular Movements: Extraocular movements intact.     Conjunctiva/sclera: Conjunctivae normal.  Pupils: Pupils are equal, round, and reactive to light.  Cardiovascular:     Rate and Rhythm: Normal rate and regular rhythm.     Pulses: Normal pulses.     Heart sounds: Normal heart sounds.  Pulmonary:     Effort: Pulmonary effort is normal.     Breath sounds: Normal breath sounds.  Abdominal:     General: Abdomen is flat. Bowel sounds are normal. There is no distension.     Palpations: Abdomen is soft. There is no mass.     Tenderness: There is no abdominal tenderness. There is no right CVA tenderness, left CVA tenderness, guarding or rebound.  Genitourinary:    Comments: Deferred exam Musculoskeletal:        General: Normal range of motion.     Cervical back: Normal range of motion and neck supple. No tenderness.     Right lower leg: No edema.     Left lower leg: No edema.  Lymphadenopathy:     Cervical: No cervical adenopathy.  Skin:    General: Skin is warm and dry.      Capillary Refill: Capillary refill takes less than 2 seconds.  Neurological:     General: No focal deficit present.     Mental Status: She is alert and oriented to person, place, and time. Mental status is at baseline.  Psychiatric:        Mood and Affect: Mood normal.        Behavior: Behavior normal.        Thought Content: Thought content normal.        Judgment: Judgment normal.          No results found for any visits on 07/02/24.     Assessment & Plan:    Routine Health Maintenance and Physical Exam Discussed health promotion and safety including diet and exercise recommendations, dental health, and injury prevention. Tobacco cessation if applicable. Seat belts, sunscreen, smoke detectors, etc.    Immunization History  Administered Date(s) Administered   Anthrax 12/04/2009, 03/23/2010   IPV 02/10/2009   Influenza Nasal 09/21/2009, 07/30/2010   Influenza, Seasonal, Injecte, Preservative Fre 07/24/2011   Influenza,inj,quad, With Preservative 12/23/2018   Meningococcal Conjugate 02/10/2009   Novel Infuenza-h1n1-09 02/10/2009   PPD Test 08/15/2010   Smallpox 07/27/2009   Tdap 02/10/2009, 02/11/2009, 11/04/2017, 11/09/2018   Typhoid Inactivated 05/15/2009    Health Maintenance  Topic Date Due   HIV Screening  Never done   Hepatitis C Screening  Never done   Cervical Cancer Screening (HPV/Pap Cotest)  Never done   Pneumococcal Vaccine (1 of 2 - PCV) 01/01/2025 (Originally 07/02/2009)   COVID-19 Vaccine (1 - 2024-25 season) 01/01/2025 (Originally 07/06/2023)   INFLUENZA VACCINE  02/01/2025 (Originally 06/04/2024)   Hepatitis B Vaccines 19-59 Average Risk (1 of 3 - 19+ 3-dose series) 07/02/2025 (Originally 07/02/2009)   HPV VACCINES (1 - 3-dose SCDM series) 07/02/2025 (Originally 07/02/2017)   DTaP/Tdap/Td (4 - Td or Tdap) 11/09/2028   Meningococcal B Vaccine  Aged Out        Problem List Items Addressed This Visit       Active Problems   Anemia   Relevant Orders    CBC with Differential/Platelet   Cigarette nicotine dependence without complication   Discussed options. She'd like to try Wellbutrin  for cessation.       Relevant Medications   buPROPion  (WELLBUTRIN  SR) 150 MG 12 hr tablet   History of hyperlipidemia   Relevant Orders   Lipid panel  Other Visit Diagnoses       Annual physical exam    -  Primary   Relevant Orders   CBC with Differential/Platelet   Comprehensive metabolic panel with GFR   Lipid panel   TSH     Abnormal TSH       Relevant Orders   TSH     Encounter for hepatitis C screening test for low risk patient       Relevant Orders   Hepatitis C antibody     Encounter for screening for HIV       Relevant Orders   HIV Antibody (routine testing w rflx)     Screen for STD (sexually transmitted disease)       Relevant Orders   Hepatitis C antibody   HIV Antibody (routine testing w rflx)   HSV(herpes simplex vrs) 1+2 ab-IgG   RPR   Cervicovaginal ancillary only          PATIENT COUNSELING:   Advised to take 1 mg of folate supplement per day if capable of pregnancy.   Encouraged smoking cessation. Rx for Wellbutrin  sent in.  Recommend that most people either abstain from alcohol or drink within safe limits (<=14/week and <=4 drinks/occasion for males, <=7/weeks and <= 3 drinks/occasion for females) and that the risk for alcohol disorders and other health effects rises proportionally with the number of drinks per week and how often a drinker exceeds daily limits.   Diet: Recommend to adjust caloric intake to maintain or achieve ideal body weight, to reduce intake of dietary saturated fat and total fat, to limit sodium intake by avoiding high sodium foods and not adding table salt, and to maintain adequate dietary potassium and calcium preferably from fresh fruits, vegetables, and low-fat dairy products.   Emphasized the importance of regular exercise.  Injury prevention: Recommend seatbelts, safety helmets,  smoke detector, etc..   Dental health: Recommend regular tooth brushing, flossing, and dental visits.       Return in about 1 year (around 07/02/2025) for physical.     Waddell KATHEE Mon, NP

## 2024-07-02 ENCOUNTER — Other Ambulatory Visit (HOSPITAL_BASED_OUTPATIENT_CLINIC_OR_DEPARTMENT_OTHER): Payer: Self-pay

## 2024-07-02 ENCOUNTER — Other Ambulatory Visit (HOSPITAL_COMMUNITY)
Admission: RE | Admit: 2024-07-02 | Discharge: 2024-07-02 | Disposition: A | Discharge status: Home / Self Care | Source: Ambulatory Visit | Attending: Family Medicine | Admitting: Family Medicine

## 2024-07-02 ENCOUNTER — Telehealth: Payer: Self-pay | Admitting: Neurology

## 2024-07-02 ENCOUNTER — Ambulatory Visit (INDEPENDENT_AMBULATORY_CARE_PROVIDER_SITE_OTHER): Admitting: Family Medicine

## 2024-07-02 VITALS — BP 122/80 | HR 87 | Ht 63.0 in | Wt 152.0 lb

## 2024-07-02 DIAGNOSIS — R7989 Other specified abnormal findings of blood chemistry: Secondary | ICD-10-CM

## 2024-07-02 DIAGNOSIS — D649 Anemia, unspecified: Secondary | ICD-10-CM

## 2024-07-02 DIAGNOSIS — F17210 Nicotine dependence, cigarettes, uncomplicated: Secondary | ICD-10-CM | POA: Diagnosis not present

## 2024-07-02 DIAGNOSIS — Z114 Encounter for screening for human immunodeficiency virus [HIV]: Secondary | ICD-10-CM | POA: Diagnosis not present

## 2024-07-02 DIAGNOSIS — Z1159 Encounter for screening for other viral diseases: Secondary | ICD-10-CM | POA: Diagnosis not present

## 2024-07-02 DIAGNOSIS — Z0000 Encounter for general adult medical examination without abnormal findings: Secondary | ICD-10-CM

## 2024-07-02 DIAGNOSIS — Z8639 Personal history of other endocrine, nutritional and metabolic disease: Secondary | ICD-10-CM | POA: Diagnosis not present

## 2024-07-02 DIAGNOSIS — Z113 Encounter for screening for infections with a predominantly sexual mode of transmission: Secondary | ICD-10-CM | POA: Diagnosis not present

## 2024-07-02 LAB — TSH: TSH: 1.71 u[IU]/mL (ref 0.35–5.50)

## 2024-07-02 LAB — LIPID PANEL
Cholesterol: 162 mg/dL (ref 0–200)
HDL: 55.2 mg/dL (ref >39.00)
LDL Cholesterol: 99 mg/dL (ref 0–99)
NonHDL: 107.05
Total CHOL/HDL Ratio: 3
Triglycerides: 41 mg/dL (ref 0.0–149.0)
VLDL: 8.2 mg/dL (ref 0.0–40.0)

## 2024-07-02 LAB — CBC WITH DIFFERENTIAL/PLATELET
Basophils Absolute: 0 K/uL (ref 0.0–0.1)
Basophils Relative: 0.9 % (ref 0.0–3.0)
Eosinophils Absolute: 0.1 K/uL (ref 0.0–0.7)
Eosinophils Relative: 1.4 % (ref 0.0–5.0)
HCT: 37.3 % (ref 36.0–46.0)
Hemoglobin: 12.1 g/dL (ref 12.0–15.0)
Lymphocytes Relative: 41.8 % (ref 12.0–46.0)
Lymphs Abs: 2.2 K/uL (ref 0.7–4.0)
MCHC: 32.5 g/dL (ref 30.0–36.0)
MCV: 81.8 fl (ref 78.0–100.0)
Monocytes Absolute: 0.6 K/uL (ref 0.1–1.0)
Monocytes Relative: 12.1 % — ABNORMAL HIGH (ref 3.0–12.0)
Neutro Abs: 2.4 K/uL (ref 1.4–7.7)
Neutrophils Relative %: 43.8 % (ref 43.0–77.0)
Platelets: 221 K/uL (ref 150.0–400.0)
RBC: 4.56 Mil/uL (ref 3.87–5.11)
RDW: 14.1 % (ref 11.5–15.5)
WBC: 5.4 K/uL (ref 4.0–10.5)

## 2024-07-02 LAB — COMPREHENSIVE METABOLIC PANEL WITH GFR
ALT: 11 U/L (ref 0–35)
AST: 16 U/L (ref 0–37)
Albumin: 4.1 g/dL (ref 3.5–5.2)
Alkaline Phosphatase: 45 U/L (ref 39–117)
BUN: 8 mg/dL (ref 6–23)
CO2: 26 meq/L (ref 19–32)
Calcium: 9.1 mg/dL (ref 8.4–10.5)
Chloride: 103 meq/L (ref 96–112)
Creatinine, Ser: 0.63 mg/dL (ref 0.40–1.20)
GFR: 116.08 mL/min (ref >60.00)
Glucose, Bld: 77 mg/dL (ref 70–99)
Potassium: 3.8 meq/L (ref 3.5–5.1)
Sodium: 138 meq/L (ref 135–145)
Total Bilirubin: 0.7 mg/dL (ref 0.2–1.2)
Total Protein: 7.5 g/dL (ref 6.0–8.3)

## 2024-07-02 MED ORDER — BUPROPION HCL ER (SR) 150 MG PO TB12
150.0000 mg | ORAL_TABLET | Freq: Two times a day (BID) | ORAL | 1 refills | Status: AC
  Filled 2024-07-02: qty 60, 30d supply, fill #0

## 2024-07-02 NOTE — Assessment & Plan Note (Signed)
 Discussed options. She'd like to try Wellbutrin  for cessation.

## 2024-07-02 NOTE — Telephone Encounter (Signed)
 Copied from CRM [id number]. Topic: Clinical - Request for Lab/Test Order >> Jul 02, 2024  3:55 PM Drema MATSU wrote: Reason for CRM: Paulette received 3 samples and 2 of the sample were labeled correctly and last one had Redell Poe's name on it.  Reference # [id number]

## 2024-07-03 LAB — HIV ANTIBODY (ROUTINE TESTING W REFLEX): HIV 1&2 Ab, 4th Generation: NONREACTIVE

## 2024-07-03 LAB — TIQ- MISLABELED
SPECIMEN TYPE RECEIVED:: 1
Test Ordered On Req: 847291431644736000

## 2024-07-03 LAB — RPR: RPR Ser Ql: NONREACTIVE

## 2024-07-03 LAB — HSV(HERPES SIMPLEX VRS) I + II AB-IGG
HSV 1 IGG,TYPE SPECIFIC AB: <0.9 {index}
HSV 2 IGG,TYPE SPECIFIC AB: <0.9 {index}

## 2024-07-03 LAB — HEPATITIS C ANTIBODY: Hepatitis C Ab: NONREACTIVE

## 2024-07-06 ENCOUNTER — Ambulatory Visit

## 2024-07-06 LAB — CERVICOVAGINAL ANCILLARY ONLY
Bacterial Vaginitis (gardnerella): POSITIVE — AB
Candida Glabrata: NEGATIVE
Candida Vaginitis: NEGATIVE
Chlamydia: NEGATIVE
Comment: NEGATIVE
Comment: NEGATIVE
Comment: NEGATIVE
Comment: NEGATIVE
Comment: NEGATIVE
Comment: NORMAL
Neisseria Gonorrhea: NEGATIVE
Trichomonas: NEGATIVE

## 2024-07-06 NOTE — Therapy (Deleted)
 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  Rationale for Evaluation and Treatment: {HABREHAB:27488}  ONSET DATE: ***  SUBJECTIVE:   SUBJECTIVE STATEMENT: ***  PERTINENT HISTORY: *** PAIN:  Are you having pain? {OPRCPAIN:27236}  PRECAUTIONS: {Therapy precautions:24002}  RED FLAGS: {PT Red Flags:29287}   WEIGHT BEARING RESTRICTIONS: {Yes ***/No:24003}  FALLS:  Has patient fallen in last 6 months? {fallsyesno:27318}  LIVING ENVIRONMENT: Lives with: {OPRC lives with:25569::lives with their family} Lives in: {Lives in:25570} Stairs: {opstairs:27293} Has following equipment at home: {Assistive devices:23999}  OCCUPATION: ***  PLOF: {PLOF:24004}  PATIENT GOALS: ***  NEXT MD VISIT: ***  OBJECTIVE:  Note: Objective measures were completed at Evaluation unless otherwise noted.  DIAGNOSTIC FINDINGS: ***  PATIENT SURVEYS:  {rehab surveys:24030}  COGNITION: Overall cognitive status:  {cognition:24006}     SENSATION: {sensation:27233}  EDEMA:  {edema:24020}  MUSCLE LENGTH: Hamstrings: Right *** deg; Left *** deg Debby test: Right *** deg; Left *** deg  POSTURE: {posture:25561}  PALPATION: ***  LOWER EXTREMITY ROM:  {AROM/PROM:27142} ROM Right eval Left eval  Hip flexion    Hip extension    Hip abduction    Hip adduction    Hip internal rotation    Hip external rotation    Knee flexion    Knee extension    Ankle dorsiflexion    Ankle plantarflexion    Ankle inversion    Ankle eversion     (Blank rows = not tested)  LOWER EXTREMITY MMT:  MMT Right eval Left eval  Hip flexion    Hip extension    Hip abduction    Hip adduction    Hip internal rotation    Hip external rotation    Knee flexion    Knee extension    Ankle dorsiflexion    Ankle plantarflexion    Ankle inversion    Ankle eversion     (Blank rows = not tested)  LOWER EXTREMITY SPECIAL TESTS:  {LEspecialtests:26242}  FUNCTIONAL TESTS:  {Functional tests:24029}  GAIT: Distance walked: *** Assistive device utilized: {Assistive devices:23999} Level of assistance: {Levels of assistance:24026} Comments: ***  TREATMENT DATE: ***    PATIENT EDUCATION:  Education details: *** Person educated: {Person educated:25204} Education method: {Education Method:25205} Education comprehension: {Education Comprehension:25206}  HOME EXERCISE PROGRAM: ***  ASSESSMENT:  CLINICAL IMPRESSION: Patient is a *** y.o. *** who was seen today for physical therapy evaluation and treatment for ***.   OBJECTIVE IMPAIRMENTS: {opptimpairments:25111}.   ACTIVITY LIMITATIONS: {activitylimitations:27494}  PARTICIPATION LIMITATIONS: {participationrestrictions:25113}  PERSONAL FACTORS: {Personal factors:25162} are also affecting patient's functional outcome.    REHAB POTENTIAL: {rehabpotential:25112}  CLINICAL DECISION MAKING: {clinical decision making:25114}  EVALUATION COMPLEXITY: {Evaluation complexity:25115}   GOALS: Goals reviewed with patient? {yes/no:20286}  SHORT TERM GOALS: Target date: *** *** Baseline: Goal status: INITIAL  2.  *** Baseline:  Goal status: INITIAL  3.  *** Baseline:  Goal status: INITIAL  4.  *** Baseline:  Goal status: INITIAL  5.  *** Baseline:  Goal status: INITIAL  6.  *** Baseline:  Goal status: INITIAL  LONG TERM GOALS: Target date: ***  *** Baseline:  Goal status: INITIAL  2.  *** Baseline:  Goal status: INITIAL  3.  *** Baseline:  Goal status: INITIAL  4.  *** Baseline:  Goal status: INITIAL  5.  *** Baseline:  Goal status: INITIAL  6.  *** Baseline:  Goal status: INITIAL   PLAN:  PT FREQUENCY: {rehab frequency:25116}  PT DURATION: {rehab duration:25117}  PLANNED INTERVENTIONS: {rehab planned interventions:25118::97110-Therapeutic exercises,97530- Therapeutic [phone]- Neuromuscular re-education,97535- Self Rjmz,02859- Manual therapy}  PLAN FOR NEXT SESSION: ***   Anah Billard L Arti Trang, PT 07/06/2024, 8:45 AM

## 2024-07-07 ENCOUNTER — Ambulatory Visit: Payer: Self-pay | Admitting: Family Medicine

## 2024-07-07 ENCOUNTER — Other Ambulatory Visit (HOSPITAL_BASED_OUTPATIENT_CLINIC_OR_DEPARTMENT_OTHER): Payer: Self-pay

## 2024-07-07 DIAGNOSIS — N760 Acute vaginitis: Secondary | ICD-10-CM

## 2024-07-07 MED ORDER — METRONIDAZOLE 500 MG PO TABS
500.0000 mg | ORAL_TABLET | Freq: Two times a day (BID) | ORAL | 0 refills | Status: AC
  Filled 2024-07-07: qty 14, 7d supply, fill #0

## 2024-07-14 ENCOUNTER — Other Ambulatory Visit (HOSPITAL_BASED_OUTPATIENT_CLINIC_OR_DEPARTMENT_OTHER): Payer: Self-pay

## 2024-07-14 MED ORDER — METRONIDAZOLE 0.75 % VA GEL
1.0000 | Freq: Every day | VAGINAL | 0 refills | Status: AC
  Filled 2024-07-14: qty 70, 5d supply, fill #0

## 2024-07-19 ENCOUNTER — Ambulatory Visit: Attending: Podiatry | Admitting: Rehabilitation

## 2024-07-19 NOTE — Therapy (Deleted)
 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  RATIONALE FOR EVALUATION AND TREATMENT: {HABREHAB:27488}  ONSET DATE: ***  NEXT MD VISIT: ***   SUBJECTIVE:                                                                                                                                                                                                         SUBJECTIVE STATEMENT: ***  PAIN: Are you having pain? {OPRCPAIN:27236}  PERTINENT HISTORY:  ***  PRECAUTIONS: {Therapy precautions:24002}  RED FLAGS: {PT Red Flags:29287}  WEIGHT BEARING RESTRICTIONS: {Yes ***/No:24003}  FALLS:  Has patient fallen in last 6 months? {fallsyesno:27318}  LIVING ENVIRONMENT: Lives with: {OPRC lives with:25569::lives with their family} Lives in: {Lives in:25570} Stairs: {opstairs:27293} Has following equipment at home: {Assistive devices:23999}  OCCUPATION: ***  PLOF:  {PLOF:24004}  PATIENT GOALS: ***   OBJECTIVE: (objective measures completed at initial evaluation unless otherwise dated)  DIAGNOSTIC FINDINGS:  ***  PATIENT SURVEYS:  {rehab surveys:24030}  COGNITION: Overall cognitive status: {cognition:24006}    SENSATION: {sensation:27233}  EDEMA:  {edema:24020}  POSTURE:  {posture:25561}  PALPATION: ***  MUSCLE LENGTH: Hamstrings: Right *** deg; Left *** deg Thomas test: Right *** deg; Left *** deg Hamstrings: *** ITB: *** Piriformis: *** Hip flexors: *** Quads: *** Heelcord: ***  LOWER EXTREMITY ROM:  {AROM/PROM:27142} ROM Right eval Left eval  Hip flexion    Hip extension    Hip abduction    Hip adduction    Hip internal rotation    Hip external rotation  Knee flexion    Knee extension    Ankle dorsiflexion    Ankle plantarflexion    Ankle inversion    Ankle eversion     {AROM/PROM:27142} ROM Right eval Left eval  Hip flexion    Hip extension    Hip abduction    Hip adduction    Hip internal rotation    Hip external rotation    Knee flexion    Knee extension    Ankle dorsiflexion    Ankle plantarflexion    Ankle inversion    Ankle eversion    (Blank rows = not tested)  LOWER EXTREMITY MMT:  MMT Right eval Left eval  Hip flexion    Hip extension    Hip abduction    Hip adduction    Hip internal rotation    Hip external rotation    Knee flexion    Knee extension    Ankle dorsiflexion    Ankle plantarflexion    Ankle inversion    Ankle eversion     (Blank rows = not tested)  LOWER EXTREMITY SPECIAL TESTS:  {LEspecialtests:26242}  FUNCTIONAL TESTS:  {Functional tests:24029}  GAIT: Distance walked: *** Assistive device utilized: {Assistive devices:23999} Level of assistance: {Levels of assistance:24026} Gait pattern: {gait characteristics:25376} Comments: ***   TODAY'S TREATMENT:   ***   PATIENT EDUCATION:  Education details: {Education details:27468}  Person educated:  {Person educated:25204} Education method: {Education Method EU:67125} Education comprehension: {Education Comprehension:25206}  HOME EXERCISE PROGRAM: ***   ASSESSMENT:  CLINICAL IMPRESSION: Rihana Kiddy is a 33 y.o. female who was referred to physical therapy for evaluation and treatment for ***.  ***   Patient reports onset of *** pain beginning ***. Pain is worse with ***.  Patient has deficits in *** ROM, *** LE flexibility, *** strength, ***abnormal posture, and TTP with abnormal muscle tension *** which are interfering with ADLs and are impacting quality of life.  On LEFS patient scored ***/80 demonstrating *** functional limitation.  Quiana will benefit from skilled PT to address above deficits to improve mobility and activity tolerance with decreased pain interference.  OBJECTIVE IMPAIRMENTS: {opptimpairments:25111}.   ACTIVITY LIMITATIONS: {activitylimitations:27494}  PARTICIPATION LIMITATIONS: {participationrestrictions:25113}  PERSONAL FACTORS: {Personal factors:25162} are also affecting patient's functional outcome.   REHAB POTENTIAL: {rehabpotential:25112}  CLINICAL DECISION MAKING: {clinical decision making:25114}  EVALUATION COMPLEXITY: {Evaluation complexity:25115}   GOALS: Goals reviewed with patient? {yes/no:20286}  SHORT TERM GOALS: Target date: ***  Patient will be independent with initial HEP. Baseline: *** Goal status: {GOALSTATUS:25110}  2.  Patient will report at least 25% improvement in *** ankle/foot pain to improve QOL. Baseline: *** Goal status: {GOALSTATUS:25110}  3.  *** Baseline: *** Goal status: {GOALSTATUS:25110}   LONG TERM GOALS: Target date: ***  Patient will be independent with advanced/ongoing HEP to improve outcomes and carryover.  Baseline: *** Goal status: {GOALSTATUS:25110}  2.  Patient will report at least 50-75% improvement in *** ankle/foot pain to improve QOL. Baseline: *** Goal status: {GOALSTATUS:25110}  3.   Patient will demonstrate improved *** ankle AROM to {Functional status:27472} to allow for normal gait and stair mechanics. Baseline: Refer to above LE ROM table Goal status: {GOALSTATUS:25110}  4.  Patient will demonstrate improved *** strength to >/= ***/5 for improved stability and ease of mobility. Baseline: Refer to above LE MMT table Goal status: {GOALSTATUS:25110}  5.  Patient will be able to ambulate 600' with LRAD and normal gait pattern without increased foot/ankle pain to access community.  Baseline: *** Goal status: {GOALSTATUS:25110}  6. Patient will be able to ascend/descend stairs with 1 HR and reciprocal step pattern safely to access home and community.  Baseline: *** Goal status: {GOALSTATUS:25110}  7.  Patient will report >/= ***/80 on LEFS (MCID = 9 pts) to demonstrate improved functional ability. Baseline: *** Goal status: {GOALSTATUS:25110}  8.  Patient will demonstrate at least ***19/24 on DGI to decrease risk of falls. Baseline: *** Goal status: {GOALSTATUS:25110}   9.  *** Baseline: *** Goal status: {GOALSTATUS:25110}   PLAN:  PT FREQUENCY: {rehab frequency:25116}  PT DURATION: {rehab duration:25117}  PLANNED INTERVENTIONS: {rehab planned interventions:25118::97110-Therapeutic exercises,97530- Therapeutic [phone]- Neuromuscular re-education,97535- Self Rjmz,02859- Manual therapy,Patient/Family education}  PLAN FOR NEXT SESSION: PIERRETTE RED SENIOR, PT 07/19/2024, 10:19 AM

## 2024-07-20 ENCOUNTER — Ambulatory Visit: Admitting: Podiatry

## 2024-08-02 ENCOUNTER — Ambulatory Visit (INDEPENDENT_AMBULATORY_CARE_PROVIDER_SITE_OTHER): Admitting: Family Medicine

## 2024-08-02 ENCOUNTER — Other Ambulatory Visit (HOSPITAL_COMMUNITY)
Admission: RE | Admit: 2024-08-02 | Discharge: 2024-08-02 | Disposition: A | Discharge status: Home / Self Care | Source: Ambulatory Visit | Attending: Family Medicine | Admitting: Family Medicine

## 2024-08-02 ENCOUNTER — Encounter: Payer: Self-pay | Admitting: Family Medicine

## 2024-08-02 VITALS — BP 103/77 | HR 82 | Ht 63.0 in | Wt 156.0 lb

## 2024-08-02 DIAGNOSIS — B9689 Other specified bacterial agents as the cause of diseases classified elsewhere: Secondary | ICD-10-CM

## 2024-08-02 DIAGNOSIS — N760 Acute vaginitis: Secondary | ICD-10-CM

## 2024-08-02 NOTE — Progress Notes (Signed)
   Established Patient Office Visit  Subjective   Patient ID: Amanda Powell, female    DOB: 06-08-1991  Age: 33 y.o. MRN: [id number]  Chief Complaint  Patient presents with   Medical Management of Chronic Issues    HPI    Discussed the use of AI scribe software for clinical note transcription with the patient, who gave verbal consent to proceed.  History of Present Illness Amanda Powell is a 33 year old female who presents with recent bacterial vaginosis.  She experiences occasional itching still, but other symptoms resolved. . No current pelvic or back pain.  She denies any fever.  She would like to repeat testing to ensure infection has resolved.           ROS All review of systems negative except what is listed in the HPI    Objective:     BP 103/77   Pulse 82   Ht 5' 3 (1.6 m)   Wt 156 lb (70.8 kg)   SpO2 100%   BMI 27.63 kg/m    Physical Exam Vitals reviewed.  Constitutional:      General: She is not in acute distress.    Appearance: Normal appearance. She is not ill-appearing.  Neurological:     Mental Status: She is alert and oriented to person, place, and time.  Psychiatric:        Mood and Affect: Mood normal.        Behavior: Behavior normal.        Thought Content: Thought content normal.        Judgment: Judgment normal.        No results found for any visits on 08/02/24.    The ASCVD Risk score (Arnett DK, et al., 2019) failed to calculate for the following reasons:   The 2019 ASCVD risk score is only valid for ages 14 to 17    Assessment & Plan:   Problem List Items Addressed This Visit   None Visit Diagnoses       BV (bacterial vaginosis)    -  Primary   Relevant Orders   Cervicovaginal ancillary only       Assessment & Plan Symptoms improving, but final assessment of response to treatment is pending.  - Send swab to confirm      Return if symptoms worsen or fail to improve.    Waddell KATHEE Mon, NP

## 2024-08-03 ENCOUNTER — Encounter: Payer: Self-pay | Admitting: Podiatry

## 2024-08-03 ENCOUNTER — Encounter: Payer: Self-pay | Admitting: Lab

## 2024-08-03 ENCOUNTER — Ambulatory Visit (INDEPENDENT_AMBULATORY_CARE_PROVIDER_SITE_OTHER): Admitting: Podiatry

## 2024-08-03 DIAGNOSIS — G8929 Other chronic pain: Secondary | ICD-10-CM | POA: Diagnosis not present

## 2024-08-03 DIAGNOSIS — M25562 Pain in left knee: Secondary | ICD-10-CM

## 2024-08-03 DIAGNOSIS — L603 Nail dystrophy: Secondary | ICD-10-CM

## 2024-08-03 LAB — CERVICOVAGINAL ANCILLARY ONLY
Bacterial Vaginitis (gardnerella): POSITIVE — AB
Candida Glabrata: NEGATIVE
Candida Vaginitis: NEGATIVE
Chlamydia: NEGATIVE
Comment: NEGATIVE
Comment: NEGATIVE
Comment: NEGATIVE
Comment: NEGATIVE
Comment: NEGATIVE
Comment: NORMAL
Neisseria Gonorrhea: NEGATIVE
Trichomonas: NEGATIVE

## 2024-08-03 NOTE — Progress Notes (Signed)
 She presents today for her nail pathology report.  We are also going to go over her over read report from her MRI of her left plantar fascial pain.  She goes on to say that she is having pain in her left knee and in her left thigh and does not know if it is related to her foot.  Objective: Vitals are stable alert oriented x 3.  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  Over read demonstrates normal ankle no plantar fasciitis.  Assessment: Plantar fascial type pain left.  Thigh and knee pain left.  Also nail pathology consistent with onychomycosis.  Plan: Discussed etiology pathology conservative surgical therapies at this point I recommended that we discuss oral therapy laser therapy and topical therapy for her toenail fungus.  At this point she would like to start with laser therapy.  So we will go ahead and get started with that I am going to request that we put in a referral for orthopedics for her knee and thigh.

## 2024-08-04 ENCOUNTER — Other Ambulatory Visit (HOSPITAL_BASED_OUTPATIENT_CLINIC_OR_DEPARTMENT_OTHER): Payer: Self-pay

## 2024-08-04 ENCOUNTER — Ambulatory Visit: Payer: Self-pay | Admitting: Family Medicine

## 2024-08-04 DIAGNOSIS — B9689 Other specified bacterial agents as the cause of diseases classified elsewhere: Secondary | ICD-10-CM

## 2024-08-04 MED ORDER — CLINDAMYCIN PHOSPHATE 2 % VA CREA
1.0000 | TOPICAL_CREAM | Freq: Every day | VAGINAL | 0 refills | Status: DC
  Filled 2024-08-04: qty 40, 7d supply, fill #0

## 2024-08-05 ENCOUNTER — Other Ambulatory Visit (HOSPITAL_BASED_OUTPATIENT_CLINIC_OR_DEPARTMENT_OTHER): Payer: Self-pay

## 2024-08-05 MED ORDER — METRONIDAZOLE 0.75 % VA GEL
1.0000 | Freq: Every day | VAGINAL | 0 refills | Status: AC
  Filled 2024-08-05: qty 70, 7d supply, fill #0

## 2024-09-09 DIAGNOSIS — M25562 Pain in left knee: Secondary | ICD-10-CM | POA: Diagnosis not present

## 2024-09-10 ENCOUNTER — Ambulatory Visit (INDEPENDENT_AMBULATORY_CARE_PROVIDER_SITE_OTHER): Payer: Self-pay

## 2024-09-10 DIAGNOSIS — L603 Nail dystrophy: Secondary | ICD-10-CM

## 2024-09-10 NOTE — Patient Instructions (Signed)

## 2024-09-10 NOTE — Progress Notes (Signed)
 Patient presents today for the 1st laser treatment. Diagnosed with mycotic nail infection by Dr. Verta.   Toenail most affected left 1st hallux. The nail is extremely dark and thick. The patient verbalized significant anxiety about procedure so a timeout was performed to thoroughly explain procedure and process. The patient acknowledged understanding and agreed to proceed with treatment.   All other systems are negative.  Nails were filed thin. Laser therapy was administered to 1st toenail left foot and patient tolerated the treatment well. All safety precautions were in place.   Post treatment instructions reviewed and provided to patient. Patient had no questions regarding plan of care.   Follow up in 4 weeks for laser # 2.

## 2024-10-07 ENCOUNTER — Encounter: Payer: Self-pay | Admitting: Podiatry

## 2024-10-08 ENCOUNTER — Ambulatory Visit

## 2024-10-08 NOTE — Progress Notes (Signed)
  Patient presents today for the 2nd laser treatment. Diagnosed with mycotic nail infection by Dr. Verta.    Toenail most affected left 1st hallux. The nail is extremely dark and thick.    All other systems are negative.   Nails were filed thin. Laser therapy was administered to 1st toenail left foot and patient tolerated the treatment well. All safety precautions were in place.    Post treatment instructions reviewed and provided to patient. Patient had no questions regarding plan of care.    Follow up in 6 weeks for laser # 3

## 2024-10-25 ENCOUNTER — Encounter: Payer: Self-pay | Admitting: Family Medicine

## 2024-10-25 ENCOUNTER — Ambulatory Visit: Admitting: Family Medicine

## 2024-10-25 ENCOUNTER — Other Ambulatory Visit (HOSPITAL_COMMUNITY)
Admission: RE | Admit: 2024-10-25 | Discharge: 2024-10-25 | Disposition: A | Discharge status: Home / Self Care | Source: Ambulatory Visit | Attending: Family Medicine | Admitting: Family Medicine

## 2024-10-25 VITALS — BP 117/70 | HR 96 | Temp 97.8°F | Ht 63.0 in | Wt 166.6 lb

## 2024-10-25 DIAGNOSIS — Z113 Encounter for screening for infections with a predominantly sexual mode of transmission: Secondary | ICD-10-CM | POA: Insufficient documentation

## 2024-10-25 NOTE — Progress Notes (Signed)
 "  Acute Office Visit  Subjective:  Patient ID: Amanda Powell, female    DOB: Jun 21, 1991  Age: 33 y.o. MRN: [id number]  CC:  Chief Complaint  Patient presents with   Follow-up    Retest for BV      HPI Amanda Powell is here for STD/BV testing.    She had recurrent BV a few months ago and has recently been sexually active again. No current symptoms, would like to repeat swab to ensure no infection.       Past Medical History:  Diagnosis Date   Anxiety     Past Surgical History:  Procedure Laterality Date   FINGER SURGERY      Family History  Problem Relation Age of Onset   Anxiety disorder Mother    Depression Mother    Hypertension Mother    Hyperlipidemia Mother    Diabetes Maternal Grandmother    Pancreatic cancer Maternal Aunt     Social History   Socioeconomic History   Marital status: Single    Spouse name: Not on file   Number of children: Not on file   Years of education: Not on file   Highest education level: Associate degree: occupational, scientist, product/process development, or vocational program  Occupational History   Not on file  Tobacco Use   Smoking status: Every Day    Current packs/day: 0.50    Average packs/day: 0.5 packs/day for 13.0 years (6.5 ttl pk-yrs)    Types: Cigarettes    Start date: 2013   Smokeless tobacco: Never  Substance and Sexual Activity   Alcohol use: Never   Drug use: Never   Sexual activity: Not on file  Other Topics Concern   Not on file  Social History Narrative   Not on file   Social Drivers of Health   Tobacco Use: High Risk (10/25/2024)   Patient History    Smoking Tobacco Use: Every Day    Smokeless Tobacco Use: Never    Passive Exposure: Not on file  Financial Resource Strain: Low Risk (07/02/2024)   Overall Financial Resource Strain (CARDIA)    Difficulty of Paying Living Expenses: Not very hard  Food Insecurity: No Food Insecurity (07/02/2024)   Epic    Worried About Radiation Protection Practitioner of Food in the Last Year: Never true     Ran Out of Food in the Last Year: Never true  Transportation Needs: No Transportation Needs (07/02/2024)   Epic    Lack of Transportation (Medical): No    Lack of Transportation (Non-Medical): No  Physical Activity: Insufficiently Active (07/02/2024)   Exercise Vital Sign    Days of Exercise per Week: 1 day    Minutes of Exercise per Session: 10 min  Stress: Stress Concern Present (07/02/2024)   [employer] of Occupational Health - Occupational Stress Questionnaire    Feeling of Stress: To some extent  Social Connections: Socially Isolated (07/02/2024)   Social Connection and Isolation Panel    Frequency of Communication with Friends and Family: More than three times a week    Frequency of Social Gatherings with Friends and Family: Once a week    Attends Religious Services: Never    Database Administrator or Organizations: No    Attends Banker Meetings: Not on file    Marital Status: Never married  Intimate Partner Violence: Not on file  Depression (PHQ2-9): Low Risk (10/25/2024)   Depression (PHQ2-9)    PHQ-2 Score: 2  Alcohol Screen: Low Risk (07/02/2024)  Alcohol Screen    Last Alcohol Screening Score (AUDIT): 2  Housing: Low Risk (07/02/2024)   Epic    Unable to Pay for Housing in the Last Year: No    Number of Times Moved in the Last Year: 0    Homeless in the Last Year: No  Utilities: Low Risk (12/17/2023)   Received from [facility]   Utilities    In the past 12 months has the electric, gas, oil, or water company threatened to shut off services in your home? : No  Health Literacy: Not on file    ROS All ROS negative except what is listed in the HPI.   Objective:   Today's Vitals: BP 117/70 (BP Location: Right Arm, Patient Position: Sitting, Cuff Size: Normal)   Pulse 96   Temp 97.8 F (36.6 C) (Oral)   Ht 5' 3 (1.6 m)   Wt 166 lb 9.6 oz (75.6 kg)   LMP 10/10/2024   SpO2 100%   BMI 29.51 kg/m   Physical Exam Vitals reviewed.   Constitutional:      Appearance: Normal appearance.  Neurological:     Mental Status: She is alert and oriented to person, place, and time.  Psychiatric:        Mood and Affect: Mood normal.        Behavior: Behavior normal.        Thought Content: Thought content normal.        Judgment: Judgment normal.       Assessment & Plan:   Problem List Items Addressed This Visit   None Visit Diagnoses       Screen for STD (sexually transmitted disease)    -  Primary Self-swab today. No current symptoms.    Relevant Orders   Cervicovaginal ancillary only         Follow-up: Return if symptoms worsen or fail to improve.   Waddell FURY Almarie, DNP, FNP-C  I,Emily Lagle,acting as a neurosurgeon for Waddell KATHEE Almarie, NP.,have documented all relevant documentation on the behalf of Waddell KATHEE Almarie, NP.   I, Waddell KATHEE Almarie, NP, have reviewed all documentation for this visit. The documentation on 10/25/2024 for the exam, diagnosis, procedures, and orders are all accurate and complete. "

## 2024-10-26 LAB — CERVICOVAGINAL ANCILLARY ONLY
Bacterial Vaginitis (gardnerella): POSITIVE — AB
Candida Glabrata: NEGATIVE
Candida Vaginitis: NEGATIVE
Chlamydia: NEGATIVE
Comment: NEGATIVE
Comment: NEGATIVE
Comment: NEGATIVE
Comment: NEGATIVE
Comment: NEGATIVE
Comment: NORMAL
Neisseria Gonorrhea: NEGATIVE
Trichomonas: NEGATIVE

## 2024-10-27 ENCOUNTER — Other Ambulatory Visit (HOSPITAL_BASED_OUTPATIENT_CLINIC_OR_DEPARTMENT_OTHER): Payer: Self-pay

## 2024-10-27 ENCOUNTER — Ambulatory Visit: Payer: Self-pay | Admitting: Family Medicine

## 2024-10-27 DIAGNOSIS — B9689 Other specified bacterial agents as the cause of diseases classified elsewhere: Secondary | ICD-10-CM

## 2024-10-27 MED ORDER — BORIC ACID VAGINAL 600 MG VA SUPP
600.0000 mg | Freq: Every evening | VAGINAL | 0 refills | Status: AC
  Filled 2024-10-27: qty 24, 24d supply, fill #0

## 2024-10-27 MED ORDER — METRONIDAZOLE 500 MG PO TABS
500.0000 mg | ORAL_TABLET | Freq: Two times a day (BID) | ORAL | 0 refills | Status: AC
  Filled 2024-10-27: qty 14, 7d supply, fill #0

## 2024-11-05 ENCOUNTER — Other Ambulatory Visit

## 2024-12-03 ENCOUNTER — Ambulatory Visit (INDEPENDENT_AMBULATORY_CARE_PROVIDER_SITE_OTHER): Payer: Self-pay

## 2024-12-03 DIAGNOSIS — L603 Nail dystrophy: Secondary | ICD-10-CM

## 2024-12-03 NOTE — Patient Instructions (Signed)

## 2024-12-03 NOTE — Progress Notes (Signed)
 Patient presents today for the 3rd laser treatment. Diagnosed with mycotic nail infection by Dr. Verta.   Toenail most affected left hallux. Left hallux nail is very dark and thick.  All other systems are negative.  Left hallux nail was filed thin. Laser therapy was administered to 1st  toenail left foot and patient tolerated the treatment well. All safety precautions were in place.   Post treatment instructions reviewed and provided to patient. Patient had no questions regarding plan of care.   Follow up in 6 weeks for laser # 4.

## 2025-01-14 ENCOUNTER — Ambulatory Visit
